# Patient Record
Sex: Male | Born: 1990 | Race: White | Hispanic: No | Marital: Single | Smoking: Current every day smoker
Health system: Southern US, Community
[De-identification: ages and names within clinical notes are randomized; demographics above are authoritative.]

---
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
## 2013-04-07 ENCOUNTER — Encounter (HOSPITAL_COMMUNITY): Payer: Self-pay | Admitting: Emergency Medicine

## 2013-04-07 ENCOUNTER — Emergency Department (HOSPITAL_COMMUNITY)
Admission: EM | Admit: 2013-04-07 | Discharge: 2013-04-07 | Disposition: A | Discharge status: Rehabilitation Facility | Payer: 59 | Attending: Emergency Medicine | Admitting: Emergency Medicine

## 2013-04-07 DIAGNOSIS — F1410 Cocaine abuse, uncomplicated: Secondary | ICD-10-CM | POA: Insufficient documentation

## 2013-04-07 DIAGNOSIS — F32A Depression, unspecified: Secondary | ICD-10-CM

## 2013-04-07 DIAGNOSIS — F329 Major depressive disorder, single episode, unspecified: Secondary | ICD-10-CM | POA: Insufficient documentation

## 2013-04-07 DIAGNOSIS — F3289 Other specified depressive episodes: Secondary | ICD-10-CM | POA: Insufficient documentation

## 2013-04-07 DIAGNOSIS — F411 Generalized anxiety disorder: Secondary | ICD-10-CM | POA: Insufficient documentation

## 2013-04-07 DIAGNOSIS — F1120 Opioid dependence, uncomplicated: Secondary | ICD-10-CM

## 2013-04-07 DIAGNOSIS — F1210 Cannabis abuse, uncomplicated: Secondary | ICD-10-CM | POA: Insufficient documentation

## 2013-04-07 LAB — RAPID URINE DRUG SCREEN, HOSP PERFORMED
AMPHETAMINES: NOT DETECTED
BARBITURATES: NOT DETECTED
BENZODIAZEPINES: NOT DETECTED
COCAINE: POSITIVE — AB
Opiates: NOT DETECTED
TETRAHYDROCANNABINOL: POSITIVE — AB

## 2013-04-07 LAB — CBC
HEMATOCRIT: 42.2 % (ref 39.0–52.0)
Hemoglobin: 14.6 g/dL (ref 13.0–17.0)
MCH: 31.1 pg (ref 26.0–34.0)
MCHC: 34.6 g/dL (ref 30.0–36.0)
MCV: 89.8 fL (ref 78.0–100.0)
Platelets: 311 10*3/uL (ref 150–400)
RBC: 4.7 MIL/uL (ref 4.22–5.81)
RDW: 12.2 % (ref 11.5–15.5)
WBC: 6.3 10*3/uL (ref 4.0–10.5)

## 2013-04-07 LAB — COMPREHENSIVE METABOLIC PANEL
ALBUMIN: 4.4 g/dL (ref 3.5–5.2)
ALK PHOS: 49 U/L (ref 39–117)
ALT: 12 U/L (ref 0–53)
AST: 16 U/L (ref 0–37)
BUN: 10 mg/dL (ref 6–23)
CO2: 26 meq/L (ref 19–32)
Calcium: 9.7 mg/dL (ref 8.4–10.5)
Chloride: 106 mEq/L (ref 96–112)
Creatinine, Ser: 1.09 mg/dL (ref 0.50–1.35)
GFR calc Af Amer: >90 mL/min (ref >90)
Glucose, Bld: 101 mg/dL — ABNORMAL HIGH (ref 70–99)
POTASSIUM: 4.1 meq/L (ref 3.7–5.3)
SODIUM: 145 meq/L (ref 137–147)
Total Bilirubin: 0.6 mg/dL (ref 0.3–1.2)
Total Protein: 7.9 g/dL (ref 6.0–8.3)

## 2013-04-07 LAB — ETHANOL

## 2013-04-07 LAB — SALICYLATE LEVEL

## 2013-04-07 LAB — ACETAMINOPHEN LEVEL

## 2013-04-07 MED ORDER — ONDANSETRON HCL 4 MG PO TABS: 4.0000 mg | ORAL_TABLET | Freq: Three times a day (TID) | ORAL | Status: DC | PRN

## 2013-04-07 MED ORDER — LORAZEPAM 1 MG PO TABS: 1.0000 mg | ORAL_TABLET | Freq: Three times a day (TID) | ORAL | Status: DC | PRN

## 2013-04-07 MED ORDER — NICOTINE 21 MG/24HR TD PT24: 21.0000 mg | MEDICATED_PATCH | Freq: Every day | TRANSDERMAL | Status: DC

## 2013-04-07 MED ORDER — ZOLPIDEM TARTRATE 5 MG PO TABS: 5.0000 mg | ORAL_TABLET | Freq: Every evening | ORAL | Status: DC | PRN

## 2013-04-07 MED ORDER — ALUM & MAG HYDROXIDE-SIMETH 200-200-20 MG/5ML PO SUSP: 30.0000 mL | ORAL | Status: DC | PRN

## 2013-04-07 MED ORDER — IBUPROFEN 400 MG PO TABS: 600.0000 mg | ORAL_TABLET | Freq: Three times a day (TID) | ORAL | Status: DC | PRN

## 2013-04-07 MED ORDER — ACETAMINOPHEN 325 MG PO TABS: 650.0000 mg | ORAL_TABLET | ORAL | Status: DC | PRN

## 2013-04-07 NOTE — ED Notes (Signed)
Per [facility] they are glad to accept pt.

## 2013-04-07 NOTE — ED Notes (Signed)
Referral made to [facility]

## 2013-04-07 NOTE — Discharge Instructions (Signed)
Pt to be discharged by grandfather to treatment facility.

## 2013-04-07 NOTE — ED Provider Notes (Signed)
Medical screening examination/treatment/procedure(s) were performed by non-physician practitioner and as supervising physician I was immediately available for consultation/collaboration.  EKG Interpretation   None        Shunta Mclaurin, MD 04/07/13 2132 

## 2013-04-07 NOTE — ED Notes (Signed)
Meal ordered

## 2013-04-07 NOTE — ED Notes (Signed)
Pt requesting detox from drugs. Last used marijuana today but needs detox from oxycodone and cocaine. Denies any SI or HI.

## 2013-04-07 NOTE — ED Provider Notes (Signed)
CSN: [id number]     Arrival date & time 04/07/13  1342 History   First MD Initiated Contact with Patient 04/07/13 1454     Chief Complaint  Patient presents with  . Medical Clearance   (Consider location/radiation/quality/duration/timing/severity/associated sxs/prior Treatment) HPI  Patient to the ER with requesting detox from cocaine and oxycodone. He has been using Oxycodone for the past year 1-1.5 years. He says that he was clean a few months ago for 14 days and then started using cocaine about a week ago. He reports really liking to snort stuff. He was kicked out of his suboxide clinic for two failed drug tests. His dad committed suicide two months ago (shot himself in the head, long hx of alcohol abuse) and he says he has been really depressed and having trouble coping. He denies SI/HI. He denies ETOH or other drugs. Admits to marijuana use. He denies hallucinations. He denies having any medical concerns at this time or hx of chronic health problems. No medications on a daily basis.  History reviewed. No pertinent past medical history. History reviewed. No pertinent past surgical history. History reviewed. No pertinent family history. History  Substance Use Topics  . Smoking status: Never Smoker   . Smokeless tobacco: Not on file  . Alcohol Use: No    Review of Systems The patient denies anorexia, fever, weight loss,, vision loss, decreased hearing, hoarseness, chest pain, syncope, dyspnea on exertion, peripheral edema, balance deficits, hemoptysis, abdominal pain, melena, hematochezia, severe indigestion/heartburn, hematuria, incontinence, genital sores, muscle weakness, suspicious skin lesions, transient blindness, difficulty walking, depression, unusual weight change, abnormal bleeding, enlarged lymph nodes, angioedema, and breast masses.  Allergies  Review of patient's allergies indicates no known allergies.  Home Medications  No current outpatient prescriptions on file. BP  134/85  Pulse 67  Temp(Src) 98 F (36.7 C) (Oral)  Resp 20  Ht 5\' 11"  (1.803 m)  Wt 157 lb 1.6 oz (71.26 kg)  BMI 21.92 kg/m2  SpO2 100% Physical Exam  Nursing note and vitals reviewed. Constitutional: He appears well-developed and well-nourished. No distress.  HENT:  Head: Normocephalic and atraumatic.  Eyes: Pupils are equal, round, and reactive to light.  Neck: Normal range of motion. Neck supple.  Cardiovascular: Normal rate and regular rhythm.   Pulmonary/Chest: Effort normal.  Abdominal: Soft.  Neurological: He is alert.  Skin: Skin is warm and dry.  Psychiatric: His mood appears anxious. His speech is not rapid and/or pressured. Thought content is not paranoid. He exhibits a depressed mood. He expresses no homicidal and no suicidal ideation.    ED Course  Procedures (including critical care time) Labs Review Labs Reviewed  COMPREHENSIVE METABOLIC PANEL - Abnormal; Notable for the following:    Glucose, Bld 101 (*)    All other components within normal limits  SALICYLATE LEVEL - Abnormal; Notable for the following:    Salicylate Lvl <2.0 (*)    All other components within normal limits  URINE RAPID DRUG SCREEN (HOSP PERFORMED) - Abnormal; Notable for the following:    Cocaine POSITIVE (*)    Tetrahydrocannabinol POSITIVE (*)    All other components within normal limits  ACETAMINOPHEN LEVEL  CBC  ETHANOL   Imaging Review No results found.  EKG Interpretation   None       MDM   1. Depression   2. Opiate addiction    Home med rec completed. Labs reviewed- medically cleared Holding orders placed TTS consult ordered.    Dorthula Matasiffany G Grethel Zenk, PA-C 04/07/13  1524 

## 2013-04-07 NOTE — ED Notes (Addendum)
[facility] with [facility] doing a phone assessment. Pt mother is bringing the copay for pt

## 2018-10-23 ENCOUNTER — Other Ambulatory Visit: Payer: Self-pay

## 2018-10-23 ENCOUNTER — Emergency Department (HOSPITAL_COMMUNITY)
Admission: EM | Admit: 2018-10-23 | Discharge: 2018-10-23 | Disposition: A | Discharge status: Home / Self Care | Payer: Medicaid - Out of State | Attending: Emergency Medicine | Admitting: Emergency Medicine

## 2018-10-23 ENCOUNTER — Encounter (HOSPITAL_COMMUNITY): Payer: Self-pay | Admitting: Emergency Medicine

## 2018-10-23 DIAGNOSIS — T40601A Poisoning by unspecified narcotics, accidental (unintentional), initial encounter: Secondary | ICD-10-CM | POA: Diagnosis not present

## 2018-10-23 DIAGNOSIS — R112 Nausea with vomiting, unspecified: Secondary | ICD-10-CM | POA: Insufficient documentation

## 2018-10-23 DIAGNOSIS — Y905 Blood alcohol level of 100-119 mg/100 ml: Secondary | ICD-10-CM | POA: Insufficient documentation

## 2018-10-23 DIAGNOSIS — R740 Nonspecific elevation of levels of transaminase and lactic acid dehydrogenase [LDH]: Secondary | ICD-10-CM | POA: Insufficient documentation

## 2018-10-23 DIAGNOSIS — F10920 Alcohol use, unspecified with intoxication, uncomplicated: Secondary | ICD-10-CM | POA: Insufficient documentation

## 2018-10-23 DIAGNOSIS — R7401 Elevation of levels of liver transaminase levels: Secondary | ICD-10-CM

## 2018-10-23 LAB — CBC WITH DIFFERENTIAL/PLATELET
Abs Immature Granulocytes: 0.02 10*3/uL (ref 0.00–0.07)
Basophils Absolute: 0 10*3/uL (ref 0.0–0.1)
Basophils Relative: 0 %
Eosinophils Absolute: 0.1 10*3/uL (ref 0.0–0.5)
Eosinophils Relative: 2 %
HCT: 41.4 % (ref 39.0–52.0)
Hemoglobin: 13.6 g/dL (ref 13.0–17.0)
Immature Granulocytes: 0 %
Lymphocytes Relative: 33 %
Lymphs Abs: 2.3 10*3/uL (ref 0.7–4.0)
MCH: 31.1 pg (ref 26.0–34.0)
MCHC: 32.9 g/dL (ref 30.0–36.0)
MCV: 94.7 fL (ref 80.0–100.0)
Monocytes Absolute: 0.4 10*3/uL (ref 0.1–1.0)
Monocytes Relative: 6 %
Neutro Abs: 4.1 10*3/uL (ref 1.7–7.7)
Neutrophils Relative %: 59 %
Platelets: 235 10*3/uL (ref 150–400)
RBC: 4.37 MIL/uL (ref 4.22–5.81)
RDW: 12.3 % (ref 11.5–15.5)
WBC: 6.9 10*3/uL (ref 4.0–10.5)
nRBC: 0 % (ref 0.0–0.2)

## 2018-10-23 LAB — COMPREHENSIVE METABOLIC PANEL
ALT: 174 U/L — ABNORMAL HIGH (ref 0–44)
AST: 98 U/L — ABNORMAL HIGH (ref 15–41)
Albumin: 3.8 g/dL (ref 3.5–5.0)
Alkaline Phosphatase: 41 U/L (ref 38–126)
Anion gap: 10 (ref 5–15)
BUN: 15 mg/dL (ref 6–20)
CO2: 22 mmol/L (ref 22–32)
Calcium: 8.1 mg/dL — ABNORMAL LOW (ref 8.9–10.3)
Chloride: 109 mmol/L (ref 98–111)
Creatinine, Ser: 1.14 mg/dL (ref 0.61–1.24)
GFR calc Af Amer: >60 mL/min (ref >60)
GFR calc non Af Amer: >60 mL/min (ref >60)
Glucose, Bld: 101 mg/dL — ABNORMAL HIGH (ref 70–99)
Potassium: 3.8 mmol/L (ref 3.5–5.1)
Sodium: 141 mmol/L (ref 135–145)
Total Bilirubin: 0.4 mg/dL (ref 0.3–1.2)
Total Protein: 6.6 g/dL (ref 6.5–8.1)

## 2018-10-23 LAB — RAPID URINE DRUG SCREEN, HOSP PERFORMED
Amphetamines: NOT DETECTED
Barbiturates: NOT DETECTED
Benzodiazepines: NOT DETECTED
Cocaine: NOT DETECTED
Opiates: NOT DETECTED
Tetrahydrocannabinol: POSITIVE — AB

## 2018-10-23 LAB — SALICYLATE LEVEL: Salicylate Lvl: <7 mg/dL (ref 2.8–30.0)

## 2018-10-23 LAB — ACETAMINOPHEN LEVEL: Acetaminophen (Tylenol), Serum: <10 ug/mL — ABNORMAL LOW (ref 10–30)

## 2018-10-23 LAB — ETHANOL: Alcohol, Ethyl (B): 108 mg/dL — ABNORMAL HIGH (ref <10)

## 2018-10-23 MED ORDER — SODIUM CHLORIDE 0.9 % IV BOLUS
1000.0000 mL | Freq: Once | INTRAVENOUS | Status: AC
  Administered 2018-10-23: 1000 mL via INTRAVENOUS

## 2018-10-23 MED ORDER — NALOXONE HCL 4 MG/0.1ML NA LIQD: NASAL | 0 refills | Status: AC

## 2018-10-23 NOTE — ED Triage Notes (Signed)
EMS called out because patient snorted heroin, pt was unconscious on arrival.Pt has ETOH also on board, pt vomited while on EMS van. 2 mg Narcan by police nasally. Pt given 4 mg Zofran by ems.

## 2018-10-23 NOTE — ED Provider Notes (Signed)
[facility] EMERGENCY DEPARTMENT Provider Note   CSN: [id number] Arrival date & time: 10/23/18  0009    History   Chief Complaint Chief Complaint  Patient presents with  . Ingestion    HPI Jacob Johns is a 28 y.o. male.   The history is provided by the patient and the EMS personnel.  Ingestion  He was brought in by ambulance after an apparent overdose.  He was found unconscious and given naloxone by police.  He vomited following that and was given ondansetron by EMS.  Patient admits to snorting heroin and drinking alcohol but is extremely evasive about how much either he used.  He denies other drug use.  History reviewed. No pertinent past medical history.  There are no active problems to display for this patient.   History reviewed. No pertinent surgical history.      Home Medications    Prior to Admission medications   Not on File    Family History History reviewed. No pertinent family history.  Social History Social History   Tobacco Use  . Smoking status: Never Smoker  Substance Use Topics  . Alcohol use: No  . Drug use: Yes    Types: Marijuana, Cocaine     Allergies   Patient has no known allergies.   Review of Systems Review of Systems  All other systems reviewed and are negative.    Physical Exam Updated Vital Signs BP 123/72 (BP Location: Left Arm)   Pulse 94   Temp 99.2 F (37.3 C) (Oral)   Ht 6' (1.829 m)   Wt 81.6 kg   SpO2 94%   BMI 24.41 kg/m   Physical Exam Vitals signs and nursing note reviewed.    28 year old male, resting comfortably and in no acute distress. Vital signs are normal. Oxygen saturation is 94%, which is normal. Head is normocephalic and atraumatic. PERRLA, EOMI. Oropharynx is clear. Neck is nontender and supple without adenopathy or JVD. Back is nontender and there is no CVA tenderness. Lungs are clear without rales, wheezes, or rhonchi. Chest is nontender. Heart has regular rate and rhythm  without murmur. Abdomen is soft, flat, nontender without masses or hepatosplenomegaly and peristalsis is normoactive. Extremities have no cyanosis or edema, full range of motion is present. Skin is warm and dry without rash. Neurologic: Mental status is normal, cranial nerves are intact, there are no motor or sensory deficits.  ED Treatments / Results  Labs (all labs ordered are listed, but only abnormal results are displayed) Labs Reviewed  RAPID URINE DRUG SCREEN, HOSP PERFORMED - Abnormal; Notable for the following components:      Result Value   Tetrahydrocannabinol POSITIVE (*)    All other components within normal limits  COMPREHENSIVE METABOLIC PANEL - Abnormal; Notable for the following components:   Glucose, Bld 101 (*)    Calcium 8.1 (*)    AST 98 (*)    ALT 174 (*)    All other components within normal limits  ACETAMINOPHEN LEVEL - Abnormal; Notable for the following components:   Acetaminophen (Tylenol), Serum <10 (*)    All other components within normal limits  ETHANOL - Abnormal; Notable for the following components:   Alcohol, Ethyl (B) 108 (*)    All other components within normal limits  SALICYLATE LEVEL  CBC WITH DIFFERENTIAL/PLATELET    EKG EKG Interpretation  Date/Time:  Saturday October 23 2018 00:16:31 EDT Ventricular Rate:  92 PR Interval:    QRS Duration: 111 QT  Interval:  363 QTC Calculation: 449 R Axis:   84 Text Interpretation:  Sinus rhythm RSR' in V1 or V2, right VCD or RVH No old tracing to compare Confirmed by Delora Fuel ([id number]) on 10/23/2018 12:19:26 AM  Procedures Procedures   Medications Ordered in ED Medications  sodium chloride 0.9 % bolus 1,000 mL (0 mLs Intravenous Stopped 10/23/18 0214)     Initial Impression / Assessment and Plan / ED Course  I have reviewed the triage vital signs and the nursing notes.  Pertinent lab results that were available during my care of the patient were reviewed by me and considered in my medical  decision making (see chart for details).  Probable fentanyl overdose superimposed on alcohol intoxication.  However, since the patient is a very poor historian, will check screening labs and urine drug screen to make sure no other drugs ingested.  He will need to be observed in the ED for 4 hours.  Old records are reviewed, and he has does have an ED visit in 2015 for opiate addiction.  He was observed in the emergency department with no recurrence of respiratory depression.  Labs do show elevated transaminase levels which are slightly worse than previous, probably related to ongoing ethanol abuse.  Ethanol level is only mildly elevated.  Drug screen is positive for tetrahydrocannabinol but not opiates.  However, given his clinical response to naloxone, this is likely a fentanyl overdose.  He is given a naloxone take-home kit.  Final Clinical Impressions(s) / ED Diagnoses   Final diagnoses:  Opiate overdose, accidental or unintentional, initial encounter ([place])  Alcohol intoxication, uncomplicated ([place])  Non-intractable vomiting with nausea, unspecified vomiting type  Elevated transaminase level    ED Discharge Orders         Ordered    naloxone [facility]) nasal spray 4 mg/0.1 mL     10/23/18 4982           Delora Fuel, MD 64/15/83 0430

## 2018-10-23 NOTE — Discharge Instructions (Signed)
DO NOT USE DRUGS!!! 

## 2020-10-18 ENCOUNTER — Ambulatory Visit (HOSPITAL_COMMUNITY)
Admission: EM | Admit: 2020-10-18 | Discharge: 2020-10-19 | Disposition: A | Discharge status: Other Acute Inpatient Hospital | Payer: Medicaid - Out of State | Attending: Psychiatry | Admitting: Psychiatry

## 2020-10-18 ENCOUNTER — Other Ambulatory Visit: Payer: Self-pay

## 2020-10-18 DIAGNOSIS — F1590 Other stimulant use, unspecified, uncomplicated: Secondary | ICD-10-CM | POA: Insufficient documentation

## 2020-10-18 DIAGNOSIS — Z560 Unemployment, unspecified: Secondary | ICD-10-CM | POA: Insufficient documentation

## 2020-10-18 DIAGNOSIS — F1190 Opioid use, unspecified, uncomplicated: Secondary | ICD-10-CM | POA: Insufficient documentation

## 2020-10-18 DIAGNOSIS — F332 Major depressive disorder, recurrent severe without psychotic features: Secondary | ICD-10-CM | POA: Insufficient documentation

## 2020-10-18 DIAGNOSIS — Z20822 Contact with and (suspected) exposure to covid-19: Secondary | ICD-10-CM | POA: Insufficient documentation

## 2020-10-18 DIAGNOSIS — Z79899 Other long term (current) drug therapy: Secondary | ICD-10-CM | POA: Insufficient documentation

## 2020-10-18 DIAGNOSIS — F1910 Other psychoactive substance abuse, uncomplicated: Secondary | ICD-10-CM | POA: Insufficient documentation

## 2020-10-18 DIAGNOSIS — F1290 Cannabis use, unspecified, uncomplicated: Secondary | ICD-10-CM | POA: Insufficient documentation

## 2020-10-18 LAB — POCT URINE DRUG SCREEN - MANUAL ENTRY (I-SCREEN)
POC Amphetamine UR: NOT DETECTED
POC Buprenorphine (BUP): POSITIVE — AB
POC Cocaine UR: POSITIVE — AB
POC Marijuana UR: POSITIVE — AB
POC Methadone UR: NOT DETECTED
POC Methamphetamine UR: NOT DETECTED
POC Morphine: NOT DETECTED
POC Oxazepam (BZO): NOT DETECTED
POC Oxycodone UR: POSITIVE — AB
POC Secobarbital (BAR): POSITIVE — AB

## 2020-10-18 NOTE — ED Provider Notes (Addendum)
Behavioral Health Urgent Care Medical Screening Exam  Patient Name: Jacob Johns MRN: [id number] Date of Evaluation: 10/18/20 Chief Complaint:   Diagnosis:  Final diagnoses:  Polysubstance abuse (HCC)  MDD (major depressive disorder), recurrent severe, without psychosis (HCC)    History of Present illness: Jacob Johns is a 30 y.o. male with psychiatric history of depression, polysubstance abuse, and overdose. Patient presented to [facility] with complaint of worsening depression and requesting substance abuse treatment. Patient admits to daily use of fentanyl, klonopin, and marijuana; he also reports occasional use of meth (2-3 times per month). He reports that he uses approximately 1 gram of fentanyl/day, 2-3 pills of Klonopin/day (he's unsure of the dosage), and 1-3 blunts of marijuana/day.   He reports that he is depressed due to his inability to maintain sobriety. He report he was able to achieve an 18 months period of sobriety while he was incarcerated, he was last sober in March 2020. He endorses depressive symptoms of worthlessness, hopelessness, poor sleep, poor appetite, isolation, and poor concentration. He reports that he is prescibed Zoloft and trazodone but has not been taking these medication due to "I'm scared I'm going to overdose if I mixed my prescription medication with the drugs in my system." He reports that he overdosed on fentanyl twice in July 2022.   He reports that he is motivated to get sober for his new baby that is due on October 25 2020. He states "I don't want to meet by baby as a drug addict." He reports he last attempted substance abuse treatment in [place] but was medically discharged from inpatient psychiatric treatment due to rashes on his legs that required antibiotic treatment.  Patient seen face to face and chart reviewed by this NP. Patient is alert and oriented. He denies all medical complaint. He is noted with abrasions to his legs. He is calm and  cooperative. Patient's thought process is coherent, his speech is clear and coherent. He denies SI/HI/AVH/paranoia and there was no indication that patient was responding to any internal/external stimuli or experiencing delusional thought content during this assessment. He reports he last used Klonopin yesterday 10/17/20, fentanyl 2 days ago, and used marijuana approximately 1 week ago. He says he recently relocated from [place] and currently residing with his sister. He is currently unemployed.    Psychiatric Specialty Exam  Presentation  General Appearance:Appropriate for Environment  Eye Contact:Good  Speech:Clear and Coherent  Speech Volume:Decreased  Handedness:Right   Mood and Affect  Mood:Depressed  Affect:Congruent   Thought Process  Thought Processes:Coherent  Descriptions of Associations:Intact  Orientation:Full (Time, Place and Person)  Thought Content:WDL    Hallucinations:None  Ideas of Reference:None  Suicidal Thoughts:No  Homicidal Thoughts:No   Sensorium  Memory:Immediate Good; Remote Good; Recent Good  Judgment:Fair  Insight:Good   Executive Functions  Concentration:Good  Attention Span:Good  Recall:Good  Fund of Knowledge:Good  Language:Good   Psychomotor Activity  Psychomotor Activity:Normal   Assets  Assets: No data recorded  Sleep  Sleep:Poor  Number of hours: 4   No data recorded  Physical Exam: Physical Exam Constitutional:      General: He is not in acute distress.    Appearance: Normal appearance. He is not ill-appearing, toxic-appearing or diaphoretic.  HENT:     Head: Normocephalic.  Eyes:     General:        Right eye: No discharge.        Left eye: No discharge.  Cardiovascular:     Rate  and Rhythm: Normal rate.  Pulmonary:     Effort: Pulmonary effort is normal. No respiratory distress.  Musculoskeletal:        General: Normal range of motion.     Cervical back: Normal range of motion.   Skin:    Comments: Abrasion to legs  Neurological:     Mental Status: He is alert and oriented to person, place, and time.  Psychiatric:        Attention and Perception: Attention normal. He is attentive. He does not perceive auditory or visual hallucinations.        Mood and Affect: Mood is anxious and depressed.        Speech: Speech normal.        Behavior: Behavior normal. Behavior is cooperative.        Thought Content: Thought content normal. Thought content is not paranoid or delusional. Thought content does not include homicidal or suicidal ideation. Thought content does not include homicidal or suicidal plan.        Cognition and Memory: Cognition normal.   Review of Systems  Constitutional: Negative.   HENT: Negative.    Eyes: Negative.   Respiratory: Negative.    Cardiovascular: Negative.   Gastrointestinal: Negative.   Genitourinary: Negative.   Musculoskeletal: Negative.   Skin:        Abrasion to legs   Neurological: Negative.   Endo/Heme/Allergies: Negative.   Psychiatric/Behavioral:  Positive for depression and substance abuse. Negative for hallucinations, memory loss and suicidal ideas. The patient has insomnia. The patient is not nervous/anxious.   Blood pressure 117/75, pulse 92, temperature 98.5 F (36.9 C), temperature source Oral, resp. rate 18, SpO2 98 %. There is no height or weight on file to calculate BMI.  Musculoskeletal: Strength & Muscle Tone: within normal limits Gait & Station: normal Patient leans: Right   [facility] MSE Discharge Disposition for Follow up and Recommendations: Based on my evaluation I certify that psychiatric inpatient services furnished can reasonably be expected to improve the patient's condition which I recommend transfer to an appropriate accepting facility.    Maricela Bo, NP 10/18/2020, 11:18 PM

## 2020-10-18 NOTE — Progress Notes (Signed)
   10/18/20 2157  [facility] Triage Screening (Walk-ins at [facility] only)  How Did You Hear About [place]? Self  What Is the Reason for Your Visit/Call Today? Jacob Johns is a 30 year old male presenting voluntarily to [facility] due SI with plan to overdose on drugs. Patient reported diagnosis of schizophrenia. Patient reported onset of SI for 1 month. Patient reported stressors/triggers include loosing job 2 months ago, worsening depression and drug usage. Patient reported expecting birth of daughter 10/25/20, "I don't want her to meet me, I look in the mirror and hate everything I see". Patient reported using fentanyl daily, pain pills daily, methamphetamines 5-6x monthly and marijuana whenever there is money left over. Patient reported auditory hallucinations and having full conversations with voices. Patient denied command voices. Patient was unable to contract for safety. Patient was cooperative during assessment.  How Long Has This Been Causing You Problems? 1 wk - 1 month  Have You Recently Had Any Thoughts About Hurting Yourself? Yes  How long ago did you have thoughts about hurting yourself? today  Are You Planning to Commit Suicide/Harm Yourself At This time? Yes  Have you Recently Had Thoughts About Hurting Someone [name]? No  Are You Planning To Harm Someone At This Time? No  Are you currently experiencing any auditory, visual or other hallucinations? No  Have You Used Any Alcohol or Drugs in the Past 24 Hours? Yes  How long ago did you use Drugs or Alcohol? today  What Did You Use and How Much? fentanyl and pain pills  Do you have any current medical co-morbidities that require immediate attention? No  Clinician description of patient physical appearance/behavior: normal  What Do You Feel Would Help You the Most Today? Treatment for Depression or other mood problem;Alcohol or Drug Use Treatment  If access to [facility] Urgent [facility] was not available, would you have sought care in the Emergency Department? Yes   Determination of Need Emergent (2 hours)  Options For Referral Chemical Dependency Intensive Outpatient Therapy (CDIOP)

## 2020-10-19 ENCOUNTER — Inpatient Hospital Stay (HOSPITAL_COMMUNITY)
Admission: AD | Admit: 2020-10-19 | Discharge: 2020-10-23 | DRG: 897 | Disposition: A | Discharge status: Home / Self Care | Payer: Medicaid - Out of State | Attending: Emergency Medicine | Admitting: Emergency Medicine

## 2020-10-19 ENCOUNTER — Encounter (HOSPITAL_COMMUNITY): Payer: Self-pay | Admitting: Urology

## 2020-10-19 DIAGNOSIS — Y905 Blood alcohol level of 100-119 mg/100 ml: Secondary | ICD-10-CM | POA: Diagnosis present

## 2020-10-19 DIAGNOSIS — F1994 Other psychoactive substance use, unspecified with psychoactive substance-induced mood disorder: Secondary | ICD-10-CM | POA: Diagnosis not present

## 2020-10-19 DIAGNOSIS — F1124 Opioid dependence with opioid-induced mood disorder: Secondary | ICD-10-CM | POA: Diagnosis present

## 2020-10-19 DIAGNOSIS — R45851 Suicidal ideations: Secondary | ICD-10-CM | POA: Diagnosis present

## 2020-10-19 DIAGNOSIS — F1020 Alcohol dependence, uncomplicated: Secondary | ICD-10-CM | POA: Diagnosis present

## 2020-10-19 DIAGNOSIS — Z20822 Contact with and (suspected) exposure to covid-19: Secondary | ICD-10-CM | POA: Diagnosis present

## 2020-10-19 DIAGNOSIS — F17210 Nicotine dependence, cigarettes, uncomplicated: Secondary | ICD-10-CM | POA: Diagnosis present

## 2020-10-19 DIAGNOSIS — F419 Anxiety disorder, unspecified: Secondary | ICD-10-CM | POA: Diagnosis present

## 2020-10-19 DIAGNOSIS — F1120 Opioid dependence, uncomplicated: Secondary | ICD-10-CM | POA: Diagnosis present

## 2020-10-19 DIAGNOSIS — F332 Major depressive disorder, recurrent severe without psychotic features: Secondary | ICD-10-CM | POA: Insufficient documentation

## 2020-10-19 DIAGNOSIS — G4700 Insomnia, unspecified: Secondary | ICD-10-CM | POA: Diagnosis present

## 2020-10-19 DIAGNOSIS — F1920 Other psychoactive substance dependence, uncomplicated: Secondary | ICD-10-CM | POA: Diagnosis present

## 2020-10-19 LAB — CBC WITH DIFFERENTIAL/PLATELET
Abs Immature Granulocytes: 0.04 10*3/uL (ref 0.00–0.07)
Basophils Absolute: 0 10*3/uL (ref 0.0–0.1)
Basophils Relative: 0 %
Eosinophils Absolute: 0.1 10*3/uL (ref 0.0–0.5)
Eosinophils Relative: 1 %
HCT: 39.4 % (ref 39.0–52.0)
Hemoglobin: 12.7 g/dL — ABNORMAL LOW (ref 13.0–17.0)
Immature Granulocytes: 0 %
Lymphocytes Relative: 23 %
Lymphs Abs: 2.5 10*3/uL (ref 0.7–4.0)
MCH: 30.2 pg (ref 26.0–34.0)
MCHC: 32.2 g/dL (ref 30.0–36.0)
MCV: 93.6 fL (ref 80.0–100.0)
Monocytes Absolute: 0.6 10*3/uL (ref 0.1–1.0)
Monocytes Relative: 6 %
Neutro Abs: 7.8 10*3/uL — ABNORMAL HIGH (ref 1.7–7.7)
Neutrophils Relative %: 70 %
Platelets: 402 10*3/uL — ABNORMAL HIGH (ref 150–400)
RBC: 4.21 MIL/uL — ABNORMAL LOW (ref 4.22–5.81)
RDW: 15.9 % — ABNORMAL HIGH (ref 11.5–15.5)
WBC: 11.1 10*3/uL — ABNORMAL HIGH (ref 4.0–10.5)
nRBC: 0 % (ref 0.0–0.2)

## 2020-10-19 LAB — HEMOGLOBIN A1C
Hgb A1c MFr Bld: 5.7 % — ABNORMAL HIGH (ref 4.8–5.6)
Mean Plasma Glucose: 116.89 mg/dL

## 2020-10-19 LAB — COMPREHENSIVE METABOLIC PANEL
ALT: 173 U/L — ABNORMAL HIGH (ref 0–44)
AST: 62 U/L — ABNORMAL HIGH (ref 15–41)
Albumin: 3.6 g/dL (ref 3.5–5.0)
Alkaline Phosphatase: 120 U/L (ref 38–126)
Anion gap: 8 (ref 5–15)
BUN: 16 mg/dL (ref 6–20)
CO2: 30 mmol/L (ref 22–32)
Calcium: 9.5 mg/dL (ref 8.9–10.3)
Chloride: 102 mmol/L (ref 98–111)
Creatinine, Ser: 0.93 mg/dL (ref 0.61–1.24)
GFR, Estimated: >60 mL/min (ref >60)
Glucose, Bld: 84 mg/dL (ref 70–99)
Potassium: 4.3 mmol/L (ref 3.5–5.1)
Sodium: 140 mmol/L (ref 135–145)
Total Bilirubin: 0.4 mg/dL (ref 0.3–1.2)
Total Protein: 7.7 g/dL (ref 6.5–8.1)

## 2020-10-19 LAB — RESP PANEL BY RT-PCR (FLU A&B, COVID) ARPGX2
Influenza A by PCR: NEGATIVE
Influenza B by PCR: NEGATIVE
SARS Coronavirus 2 by RT PCR: NEGATIVE

## 2020-10-19 LAB — LIPID PANEL
Cholesterol: 66 mg/dL (ref 0–200)
HDL: 26 mg/dL — ABNORMAL LOW (ref >40)
LDL Cholesterol: 30 mg/dL (ref 0–99)
Total CHOL/HDL Ratio: 2.5 RATIO
Triglycerides: 49 mg/dL (ref <150)
VLDL: 10 mg/dL (ref 0–40)

## 2020-10-19 LAB — T4, FREE: Free T4: 0.94 ng/dL (ref 0.61–1.12)

## 2020-10-19 LAB — TSH: TSH: 5.733 u[IU]/mL — ABNORMAL HIGH (ref 0.350–4.500)

## 2020-10-19 MED ORDER — CARBAMAZEPINE 100 MG PO CHEW
100.0000 mg | CHEWABLE_TABLET | Freq: Three times a day (TID) | ORAL | Status: DC
  Administered 2020-10-19 – 2020-10-22 (×9): 100 mg via ORAL
  Filled 2020-10-19 (×16): qty 1

## 2020-10-19 MED ORDER — ENSURE ENLIVE PO LIQD
237.0000 mL | Freq: Two times a day (BID) | ORAL | Status: DC
  Administered 2020-10-19 – 2020-10-22 (×8): 237 mL via ORAL
  Filled 2020-10-19 (×15): qty 237

## 2020-10-19 MED ORDER — HYDROXYZINE HCL 25 MG PO TABS: 25.0000 mg | ORAL_TABLET | Freq: Four times a day (QID) | ORAL | Status: DC | PRN

## 2020-10-19 MED ORDER — LORAZEPAM 1 MG PO TABS
1.0000 mg | ORAL_TABLET | Freq: Three times a day (TID) | ORAL | Status: AC
  Administered 2020-10-20 (×3): 1 mg via ORAL
  Filled 2020-10-19 (×3): qty 1

## 2020-10-19 MED ORDER — LORAZEPAM 1 MG PO TABS
1.0000 mg | ORAL_TABLET | Freq: Every day | ORAL | Status: AC
  Administered 2020-10-22: 1 mg via ORAL
  Filled 2020-10-19: qty 1

## 2020-10-19 MED ORDER — LORAZEPAM 1 MG PO TABS: 1.0000 mg | ORAL_TABLET | Freq: Once | ORAL | Status: DC

## 2020-10-19 MED ORDER — MAGNESIUM HYDROXIDE 400 MG/5ML PO SUSP: 30.0000 mL | Freq: Every day | ORAL | Status: DC | PRN

## 2020-10-19 MED ORDER — DICYCLOMINE HCL 20 MG PO TABS: 20.0000 mg | ORAL_TABLET | Freq: Four times a day (QID) | ORAL | Status: DC | PRN

## 2020-10-19 MED ORDER — LOPERAMIDE HCL 2 MG PO CAPS: 2.0000 mg | ORAL_CAPSULE | ORAL | Status: DC | PRN

## 2020-10-19 MED ORDER — ONDANSETRON 4 MG PO TBDP
4.0000 mg | ORAL_TABLET | Freq: Four times a day (QID) | ORAL | Status: DC | PRN
  Administered 2020-10-19 – 2020-10-20 (×3): 4 mg via ORAL
  Filled 2020-10-19 (×3): qty 1

## 2020-10-19 MED ORDER — LORAZEPAM 1 MG PO TABS
1.0000 mg | ORAL_TABLET | Freq: Four times a day (QID) | ORAL | Status: AC
  Administered 2020-10-19 – 2020-10-20 (×4): 1 mg via ORAL
  Filled 2020-10-19 (×4): qty 1

## 2020-10-19 MED ORDER — LORAZEPAM 1 MG PO TABS
1.0000 mg | ORAL_TABLET | Freq: Two times a day (BID) | ORAL | Status: AC
  Administered 2020-10-21 (×2): 1 mg via ORAL
  Filled 2020-10-19 (×2): qty 1

## 2020-10-19 MED ORDER — HYDROXYZINE HCL 25 MG PO TABS
25.0000 mg | ORAL_TABLET | Freq: Four times a day (QID) | ORAL | Status: DC | PRN
  Administered 2020-10-19 – 2020-10-21 (×4): 25 mg via ORAL
  Filled 2020-10-19 (×3): qty 1

## 2020-10-19 MED ORDER — LORAZEPAM 1 MG PO TABS
1.0000 mg | ORAL_TABLET | Freq: Once | ORAL | Status: AC
  Administered 2020-10-19: 1 mg via ORAL
  Filled 2020-10-19: qty 1

## 2020-10-19 MED ORDER — METHOCARBAMOL 500 MG PO TABS
500.0000 mg | ORAL_TABLET | Freq: Three times a day (TID) | ORAL | Status: DC | PRN
  Administered 2020-10-20 – 2020-10-21 (×4): 500 mg via ORAL
  Filled 2020-10-19 (×4): qty 1

## 2020-10-19 MED ORDER — TRAZODONE HCL 50 MG PO TABS
50.0000 mg | ORAL_TABLET | Freq: Every evening | ORAL | Status: DC | PRN
  Administered 2020-10-20 – 2020-10-21 (×3): 50 mg via ORAL
  Filled 2020-10-19 (×3): qty 1

## 2020-10-19 MED ORDER — ONDANSETRON 4 MG PO TBDP: 4.0000 mg | ORAL_TABLET | Freq: Four times a day (QID) | ORAL | Status: DC | PRN

## 2020-10-19 MED ORDER — LEVETIRACETAM 250 MG PO TABS
250.0000 mg | ORAL_TABLET | Freq: Two times a day (BID) | ORAL | Status: DC
  Administered 2020-10-19: 250 mg via ORAL
  Filled 2020-10-19 (×5): qty 1

## 2020-10-19 MED ORDER — NICOTINE 14 MG/24HR TD PT24
14.0000 mg | MEDICATED_PATCH | Freq: Every day | TRANSDERMAL | Status: DC
  Administered 2020-10-19 – 2020-10-23 (×5): 14 mg via TRANSDERMAL
  Filled 2020-10-19 (×9): qty 1

## 2020-10-19 MED ORDER — ACETAMINOPHEN 325 MG PO TABS
650.0000 mg | ORAL_TABLET | Freq: Four times a day (QID) | ORAL | Status: DC | PRN
  Administered 2020-10-19 – 2020-10-22 (×5): 650 mg via ORAL
  Filled 2020-10-19 (×6): qty 2

## 2020-10-19 MED ORDER — ALUM & MAG HYDROXIDE-SIMETH 200-200-20 MG/5ML PO SUSP: 30.0000 mL | ORAL | Status: DC | PRN

## 2020-10-19 MED ORDER — NAPROXEN 500 MG PO TABS
500.0000 mg | ORAL_TABLET | Freq: Two times a day (BID) | ORAL | Status: DC | PRN
  Administered 2020-10-20 – 2020-10-22 (×4): 500 mg via ORAL
  Filled 2020-10-19 (×5): qty 1

## 2020-10-19 MED ORDER — LORAZEPAM 1 MG PO TABS: 1.0000 mg | ORAL_TABLET | Freq: Four times a day (QID) | ORAL | Status: DC | PRN

## 2020-10-19 MED ORDER — THIAMINE HCL 100 MG PO TABS
100.0000 mg | ORAL_TABLET | Freq: Every day | ORAL | Status: DC
  Administered 2020-10-20 – 2020-10-23 (×4): 100 mg via ORAL
  Filled 2020-10-19 (×7): qty 1

## 2020-10-19 MED ORDER — ADULT MULTIVITAMIN W/MINERALS CH
1.0000 | ORAL_TABLET | Freq: Every day | ORAL | Status: DC
  Administered 2020-10-19 – 2020-10-23 (×5): 1 via ORAL
  Filled 2020-10-19 (×9): qty 1
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
## 2020-10-19 NOTE — Progress Notes (Signed)
Pt has been accepted to:402-1 to the service of MD Mason Jim - pending negative COVID result.  Please send voluntary form with patient.

## 2020-10-19 NOTE — Progress Notes (Signed)
NUTRITION ASSESSMENT  Pt identified as at risk on the Malnutrition Screen Tool  INTERVENTION: 1. Supplements: Ensure Plus PO BID, each provides 350 kcals and 13g protein  NUTRITION DIAGNOSIS: Unintentional weight loss related to sub-optimal intake as evidenced by pt report.   Goal: Pt to meet >/= 90% of their estimated nutrition needs.  Monitor:  PO intake  Assessment:  Pt admitted for depression and polysubstance abuse (fentanyl, klonopin, THC, meth, EtOH).  Per weight records, pt has lost 16 lbs since August 2020 (2 years), insignificant for time frame. Ensure supplements have been ordered.   Height: Ht Readings from Last 1 Encounters:  10/19/20 5\' 11"  (1.803 m)    Weight: Wt Readings from Last 1 Encounters:  10/19/20 74.4 kg    Weight Hx: Wt Readings from Last 10 Encounters:  10/19/20 74.4 kg  10/23/18 81.6 kg  04/07/13 71.3 kg    BMI:  Body mass index is 22.87 kg/m. Pt meets criteria for normal based on current BMI.  Estimated Nutritional Needs: Kcal: 25-30 kcal/kg Protein: > 1 gram protein/kg Fluid: 1 ml/kcal  Diet Order:  Diet Order             Diet regular Room service appropriate? Yes; Fluid consistency: Thin  Diet effective now                  Pt is also offered choice of unit snacks mid-morning and mid-afternoon.  Pt is eating as desired.   Lab results and medications reviewed.   06/05/13, MS, RD, LDN Inpatient Clinical Dietitian Contact information available via Amion

## 2020-10-19 NOTE — Progress Notes (Signed)
Recreation Therapy Notes  Date: 8.19.22 Time: 0930 Location: 300 Hall Dayroom  Group Topic: Stress Management   Goal Area(s) Addresses:  Patient will actively participate in stress management techniques presented during session.  Patient will successfully identify benefit of practicing stress management post d/c.   Intervention: Relaxation exercise with ambient sound and script   Activity: Guided Imagery. LRT provided education, instruction, and demonstration on practice of visualization via guided imagery. Patient was asked to participate in the technique introduced during session. Patients were given suggestions of ways to access scripts post d/c and encouraged to explore Youtube and other apps available on smartphones, tablets, and computers.  Education:  Stress Management, Discharge Planning.   Education Outcome: Acknowledges education  Clinical Observations/Feedback: Patient did not attend group session.     Jacob Johns, LRT/CTRS         Mikella Linsley A 10/19/2020 12:34 PM 

## 2020-10-19 NOTE — Progress Notes (Signed)
Met with patient upon his arrival to [facility] from [facility] to discuss his substance use patterns.  Patient endorses using 2-3 Klonopin tablets daily (patient unsure of Klonopin dosage) and reports that his last Klonopin/benzodiazepine usage was 2-3 Klonopin tablets on 10/17/2020.  Patient also endorses using 1 g of fentanyl daily and he reports that his last Fentanyl usage was 2 days ago.  Patient does report that he used an unknown amount of Dilaudid around 6:00 PM on 10/18/2020 prior to going to [facility] in order to prolong the development his future withdrawal symptoms.  Patient endorses smoking 1-3 blunts of marijuana daily and reports that his last marijuana use was 1 week ago.  Patient also endorses using meth about 2-3 times per month (patient does not provide details about when he last used meth).  Patient endorses drinking about 2-3 alcoholic beverages daily.  Patient denies history of seizures.  He does endorse history of substance related withdrawal symptoms, and he states that he believes that his withdrawal symptoms are secondary to withdrawal from benzodiazepines/Klonopin and opioids, rather than from alcohol.  Patient endorses history of the following withdrawal symptoms from benzodiazepines and opioids: Body aches/myalgias, chills, tremor, diaphoresis, nausea, vomiting, abdominal pain, diarrhea, and decreased appetite.  Patient states that he is currently experiencing body aches and myalgias and he expresses that he believes he may begin to experience the additional above withdrawal symptoms fairly soon.  Patient denies allergies to any medications.  Patient not taking any home medications at this time.  Based on patient's reported daily benzodiazepine and opioid usage as well as reported withdrawal symptoms, believe that it is necessary for patient to receive adequate benzodiazepine taper as well as adequate additional medication symptom coverage in effort to prevent severe benzodiazepine and opioid related  withdrawal symptoms or seizures.  In order to assist with future development of patient's benzodiazepine/opioid related withdrawal symptoms, as well as coverage for development of any physical symptoms related to potential alcohol withdrawal, will place orders for CIWA and COWS protocol, as well as orders for the following medications:   -One time loading dose now (given at 0504) of Ativan 1 mg PO for benzodiazepine withdrawal prior to initiating Ativan benzodiazepine withdrawal taper below   -Ativan 1 mg Taper for Benzodiazepine withdrawal:   -Ativan 1 mg PO 4 times daily, starting on 10/19/20 at 0800 for 4 doses    -Ativan 1 mg PO 3 times daily, starting on 10/20/20 at 0800 for 3 doses    -Ativan 1 mg PO 2 times daily, starting on 10/21/20 at 0800 for 2 doses   -Ativan 1 mg PO, daily, starting on 10/22/20 at 0800 for 1 dose   -Vistaril 25 mg PO every 6 hours PRN for anxiety/agitation or CIWA </= 10   -Bentyl 20 mg PO every 6 hours PRN for spasms, abdominal cramping   -Imodium capsule 2-4 mg PO PRN for diarrhea or loose stools   -Robaxin 500 mg PO every 8 hours PRN for muscle spasms   -Multivitamin with minerals 1 tablet PO daily for nutritional supplementation   -Naproxen 500 mg PO 2 times daily PRN for aching, pain, or discomfort  -Zofran ODT 4 mg PO every 6 hours PRN for nausea/vomiting   -Thiamine tablet 100 mg PO daily for nutritional supplementation   Patient agrees to the above withdrawal protocols. Patient educated on side effect profiles of medications.   Trazodone 50 mg p.o. at bedtime as needed ordered for sleep as well.    Patient also endorses  smoking about 1 pack to 1.5 packs/day of cigarettes daily.  Order placed for nicotine patch 14 mg p.o. daily x24 hours for nicotine cravings/tobacco cessation.  Patient to notify nursing staff if symptoms worsen or if any additional symptoms or issues arise.
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
## 2020-10-19 NOTE — Progress Notes (Signed)
   10/19/20 1147  Vital Signs  Temp 98.5 F (36.9 C)  Temp Source Oral  Pulse Rate 85  BP 116/73  BP Location Left Arm  BP Method Automatic  Patient Position (if appropriate) Sitting  CIWA-Ar  Nausea and Vomiting 1  Tactile Disturbances 1  Tremor 2  Auditory Disturbances 0  Paroxysmal Sweats 2  Visual Disturbances 0  Anxiety 4  Headache, Fullness in Head 0  Agitation 0  Orientation and Clouding of Sensorium 0  CIWA-Ar Total 10   D: Patient denies SI/HI/AVH. Patient rated anxiety 4/10 and denied depression. Pt. Complained of sweaty, shakiness, tremors fatigue, nausea, and achy joints. Pt. Isolated in his room, but did get up and ask for snacks. Pt. Was pleasant and cooperative. Pt. Signed an HIV consent.  A:  Patient took scheduled medicine. Pt was given Zofran for nausea.  Support and encouragement provided Routine safety checks conducted every 15 minutes. Patient  Informed to notify staff with any concerns.   R: Safety maintained.

## 2020-10-19 NOTE — ED Notes (Signed)
REPORT GIVEN TO RUDY RN@[facility]  ADULT UNIT

## 2020-10-19 NOTE — Tx Team (Signed)
Interdisciplinary Treatment and Diagnostic Plan Update  10/19/2020 Time of Session: 11:30a GOR VESTAL MRN: [id number]  Principal Diagnosis: Substance induced mood disorder ([place])  Secondary Diagnoses: Principal Problem:   Substance induced mood disorder ([place]) Active Problems:   Polysubstance (including opioids) dependence, daily use ([place])   Current Medications:  Current Facility-Administered Medications  Medication Dose Route Frequency Provider Last Rate Last Admin   acetaminophen (TYLENOL) tablet 650 mg  650 mg Oral Q6H PRN Prescilla Sours, PA-C   650 mg at 10/19/20 0936   alum & mag hydroxide-simeth (MAALOX/MYLANTA) 200-200-20 MG/5ML suspension 30 mL  30 mL Oral Q4H PRN Margorie John W, PA-C       carbamazepine (TEGRETOL) chewable tablet 100 mg  100 mg Oral Q8H Arthor Captain, MD   100 mg at 10/19/20 1315   dicyclomine (BENTYL) tablet 20 mg  20 mg Oral Q6H PRN Margorie John W, PA-C       feeding supplement (ENSURE ENLIVE / ENSURE PLUS) liquid 237 mL  237 mL Oral BID BM Nelda Marseille, Amy E, MD   237 mL at 10/19/20 0944   hydrOXYzine (ATARAX/VISTARIL) tablet 25 mg  25 mg Oral Q6H PRN Prescilla Sours, PA-C   25 mg at 10/19/20 5361   loperamide (IMODIUM) capsule 2-4 mg  2-4 mg Oral PRN Margorie John W, PA-C       LORazepam (ATIVAN) tablet 1 mg  1 mg Oral QID Margorie John W, PA-C   1 mg at 10/19/20 1142   Followed by   Derrill Memo ON 10/20/2020] LORazepam (ATIVAN) tablet 1 mg  1 mg Oral TID Prescilla Sours, PA-C       Followed by   Derrill Memo ON 10/21/2020] LORazepam (ATIVAN) tablet 1 mg  1 mg Oral BID Margorie John W, PA-C       Followed by   Derrill Memo ON 10/22/2020] LORazepam (ATIVAN) tablet 1 mg  1 mg Oral Daily Lovena Le, Cody W, PA-C       magnesium hydroxide (MILK OF MAGNESIA) suspension 30 mL  30 mL Oral Daily PRN Lovena Le, Cody W, PA-C       methocarbamol (ROBAXIN) tablet 500 mg  500 mg Oral Q8H PRN Margorie John W, PA-C       multivitamin with minerals tablet 1 tablet  1 tablet Oral Daily Margorie John W,  PA-C   1 tablet at 10/19/20 0936   naproxen (NAPROSYN) tablet 500 mg  500 mg Oral BID PRN Margorie John W, PA-C       nicotine (NICODERM CQ - dosed in mg/24 hours) patch 14 mg  14 mg Transdermal Daily Margorie John W, PA-C   14 mg at 10/19/20 0936   ondansetron (ZOFRAN-ODT) disintegrating tablet 4 mg  4 mg Oral Q6H PRN Prescilla Sours, PA-C   4 mg at 10/19/20 1314   [START ON 10/20/2020] thiamine tablet 100 mg  100 mg Oral Daily Margorie John W, PA-C       traZODone (DESYREL) tablet 50 mg  50 mg Oral QHS PRN Prescilla Sours, PA-C       PTA Medications: Medications Prior to Admission  Medication Sig Dispense Refill Last Dose   naloxone (NARCAN) nasal spray 4 mg/0.1 mL In case of an overdose, squirt into your nostril (Patient not taking: Reported on 10/19/2020) 1 each 0 Not Taking    Patient Stressors: Marital or family conflict Substance abuse  Patient Strengths: Ability for insight Motivation for treatment/growth Supportive family/friends  Treatment Modalities: Medication Management, Group therapy, Case  management,  1 to 1 session with clinician, Psychoeducation, Recreational therapy.   Physician Treatment Plan for Primary Diagnosis: Substance induced mood disorder ([place]) Long Term Goal(s):     Short Term Goals:    Medication Management: Evaluate patient's response, side effects, and tolerance of medication regimen.  Therapeutic Interventions: 1 to 1 sessions, Unit Group sessions and Medication administration.  Evaluation of Outcomes: Not Met  Physician Treatment Plan for Secondary Diagnosis: Principal Problem:   Substance induced mood disorder (H[facility]) Active Problems:   Polysubstance (including opioids) dependence, daily use ([place])  Long Term Goal(s):     Short Term Goals:       Medication Management: Evaluate patient's response, side effects, and tolerance of medication regimen.  Therapeutic Interventions: 1 to 1 sessions, Unit Group sessions and Medication  administration.  Evaluation of Outcomes: Not Met   RN Treatment Plan for Primary Diagnosis: Substance induced mood disorder ([place]) Long Term Goal(s): Knowledge of disease and therapeutic regimen to maintain health will improve  Short Term Goals: Ability to demonstrate self-control, Ability to participate in decision making will improve, and Ability to identify and develop effective coping behaviors will improve  Medication Management: RN will administer medications as ordered by provider, will assess and evaluate patient's response and provide education to patient for prescribed medication. RN will report any adverse and/or side effects to prescribing provider.  Therapeutic Interventions: 1 on 1 counseling sessions, Psychoeducation, Medication administration, Evaluate responses to treatment, Monitor vital signs and CBGs as ordered, Perform/monitor CIWA, COWS, AIMS and Fall Risk screenings as ordered, Perform wound care treatments as ordered.  Evaluation of Outcomes: Not Met   LCSW Treatment Plan for Primary Diagnosis: Substance induced mood disorder ([place]) Long Term Goal(s): Safe transition to appropriate next level of care at discharge, Engage patient in therapeutic group addressing interpersonal concerns.  Short Term Goals: Engage patient in aftercare planning with referrals and resources, Increase social support, and Increase ability to appropriately verbalize feelings  Therapeutic Interventions: Assess for all discharge needs, 1 to 1 time with Social worker, Explore available resources and support systems, Assess for adequacy in community support network, Educate family and significant other(s) on suicide prevention, Complete Psychosocial Assessment, Interpersonal group therapy.  Evaluation of Outcomes: Not Met   Progress in Treatment: Attending groups: No. Participating in groups: No. Taking medication as prescribed: Yes. Toleration medication: Yes. Family/Significant other contact  made: Yes, individual(s) contacted:  sister Patient understands diagnosis: Yes. Discussing patient identified problems/goals with staff: Yes. Medical problems stabilized or resolved: Yes. Denies suicidal/homicidal ideation: Yes. Issues/concerns per patient self-inventory: No. Other: None  New problem(s) identified: No, Describe:  None  New Short Term/Long Term Goal(s):medication stabilization, elimination of SI thoughts, development of comprehensive mental wellness plan.   Patient Goals:  "stay positive"  Discharge Plan or Barriers: Patient recently admitted. CSW will continue to follow and assess for appropriate referrals and possible discharge planning.   Reason for Continuation of Hospitalization: Medication stabilization Suicidal ideation Withdrawal symptoms  Estimated Length of Stay: 3-5 days  Attendees: Patient: Jacob Johns 10/19/2020 2:13 PM  Physician: Dr. Kai Levins 10/19/2020 2:13 PM  Nursing:  10/19/2020 2:13 PM  RN Care Manager: 10/19/2020 2:13 PM  Social Worker: Toney Reil, [place] 10/19/2020 2:13 PM  Recreational Therapist:  10/19/2020 2:13 PM  Other:  10/19/2020 2:13 PM  Other:  10/19/2020 2:13 PM  Other: 10/19/2020 2:13 PM    Scribe for Treatment Team: Mliss Fritz, [place] 10/19/2020 2:13 PM

## 2020-10-19 NOTE — Progress Notes (Addendum)
   10/19/20 1147  Vital Signs  Temp 98.5 F (36.9 C)  Temp Source Oral  Pulse Rate 85  BP 116/73  BP Location Left Arm  BP Method Automatic  Patient Position (if appropriate) Sitting  CIWA-Ar  Nausea and Vomiting 1  Tactile Disturbances 1  Tremor 2  Auditory Disturbances 0  Paroxysmal Sweats 2  Visual Disturbances 0  Anxiety 4  Headache, Fullness in Head 0  Agitation 0  Orientation and Clouding of Sensorium 0  CIWA-Ar Total 10   D: Patient denies SI/HI/AVH. Patient complained of all over body ache and a tooth ache Pt. Complained of anxiety 4/10 and denied depression. Pt. Complained of some nausea. Pt. Reported that he tried to go to group, but didn't feel well and had to leave. Ciwa =10. Pt. Moved to room closer to the nurses station and in a medical bed per DR. Pt. Isolates in room.  A:  Patient took scheduled medicine. Pt  reported that 650 mg of Tylenol relieved some of his tooth pain and 25mg  helped relieve some of his anxiety. Support and encouragement provided Routine safety checks conducted every 15 minutes. Patient  Informed to notify staff with any concerns.   R:  Safety maintained.

## 2020-10-19 NOTE — Progress Notes (Signed)
Admission Note:  Patient is 30 year old male who presents voluntarily to [facility] from [facility] for substance abuse and increased depression. Patient reports he has recently dealt with repeated overdoses and increased depression. Patient reports he is from [place] and is expecting a daughter soon. Patient reports he is depressed and disappointed in himself because he is unable to stop using drugs. Patient currently denies SI/HI at this time. Patient also denies AH/VH. Patient contracts for safety and is safe on the unit at this time.

## 2020-10-19 NOTE — BHH Suicide Risk Assessment (Signed)
[facility] Admission Suicide Risk Assessment   Nursing information obtained from:  Patient Demographic factors:  Male, Caucasian, Low socioeconomic status Current Mental Status:  NA Loss Factors:  Loss of significant relationship, Decrease in vocational status Historical Factors:  Impulsivity Risk Reduction Factors:  Positive social support  Total Time spent with patient:  50 minutes Principal Problem: Substance induced mood disorder ([place]) Diagnosis:  Principal Problem:   Substance induced mood disorder (HCC) Active Problems:   Polysubstance (including opioids) dependence, daily use ([place])  Subjective Data: Medical record reviewed.  Patient's case discussed in detail with staff and members of the treatment team.  I met with and evaluated the patient on the unit today.  Jacob Johns is a 30 year old male with a history of depression, substance use disorder (opiates, benzodiazepines, methamphetamine, marijuana, alcohol including IVDU), hep C positive and history of childhood trauma who presented voluntarily on 10/18/2020 to [facility] reporting worsening depression, increased drug use and suicidal ideation with a plan to overdose on drugs.  At [facility] patient reported a prior diagnosis of schizophrenia and endorsed symptoms of auditory hallucinations.  Patient reported that his symptoms had worsened since losing his job 2 months ago.  Notes from [facility] also document that patient stated he was prescribed trazodone and Zoloft but was not taking them because he did not like mixing them with his street drug use.  He was admitted to [facility] for crisis stabilization and treatment.  During evaluation with me this morning, patient appears tired and poorly engaged in our conversation.  He states that he came to the hospital because he was tired of using drugs and wants to stop using.  He endorsed daily use of fentanyl intravenously for the past 4 months and estimates use of 1 g/day.  Patient reports 2 accidental overdoses on  fentanyl during the past month.  He endorsed daily use of Klonopin (which he purchases off the street) for the past month and estimates that he takes 2 to 3 pills/day of unknown strength.  He consumes alcohol 2-3 times per week up to 5 shots of alcohol per day.  Patient also reports use of methamphetamine by smoking 2-3 times per months.  He denies cocaine use.  He denies barbiturate use.  Patient denies any history of DTs, complicated withdrawal from substances and denies any history of seizure.  He reports current withdrawal symptoms of fatigue, nausea, muscle aches and chills.  During interview with me today the patient denies SI, AI, PI, AH or VH.  On exam today he does not appear to attend or respond to internal stimuli.  Patient does report depressed "miserable" mood, trouble sleeping, low energy, problems concentrating and difficulty getting out of bed which have worsened in recent weeks/months. Appetite is okay.  There is no diaphoresis, rhinorrhea, lacrimation or piloerection observed on exam.  There is no tremor or other motor abnormality observable on exam today and vital signs are stable and within normal limits.  Patient showed me injection sites on antecubital fossa bilaterally.  There is no sign of abscess or infection at injection sites.  When I asked whether he is interested in residential substance abuse treatment, the patient states that it is just day 1 and it is too soon for him to decide.  Patient states that he has been sharing needles and he is receptive to STI testing including HIV testing.  Urine tox screen was positive for secobarbital, buprenorphine, cocaine, oxycodone and marijuana but not benzodiazepines.  I asked patient if he was  aware that he was taking barbiturates/secobarbital and he states that he was not.  Patient has been taking pills that he purchases off the street and he is uncertain of what is in them.  Review of PDMP shows no prescriptions reported to PDMP.  On interview  today patient denies any history of prior psychiatric diagnoses or treatments.  Information in chart notes indicates that he previously reported a history of schizophrenia and depression.  Patient denies any prior inpatient psychiatric hospitalizations during interview with me today but chart notes indicate he reported prior inpatient psychiatric treatment in [place].  He denies any history of suicide attempts.  He denies any history of nonsuicidal self-injurious behavior.  Patient reports that he is hep C positive.  He denies any history of other medical conditions including asthma, seizure, diabetes, hypertension.  He takes no medications.  He reports no known allergies.  Patient denies any family psychiatric history of mental health issues, alcohol or substance use issues or completed suicides.  Continued Clinical Symptoms:  Alcohol Use Disorder Identification Test Final Score (AUDIT): 20 The "Alcohol Use Disorders Identification Test", Guidelines for Use in Primary Care, Second Edition.  World Pharmacologist [facility]). Score between 0-7:  no or low risk or alcohol related problems. Score between 8-15:  moderate risk of alcohol related problems. Score between 16-19:  high risk of alcohol related problems. Score 20 or above:  warrants further diagnostic evaluation for alcohol dependence and treatment.   CLINICAL FACTORS:   Depression:   Anhedonia Comorbid alcohol abuse/dependence Impulsivity Insomnia Alcohol/Substance Abuse/Dependencies   Musculoskeletal: Strength & Muscle Tone: within normal limits Gait & Station: normal Patient leans: N/A  Psychiatric Specialty Exam:  Presentation  General Appearance: Appropriate for Environment  Eye Contact:Good  Speech:Normal Rate  Speech Volume:Decreased  Handedness:Right   Mood and Affect  Mood:Dysphoric; Depressed  Affect:Congruent   Thought Process  Thought Processes:Coherent  Descriptions of  Associations:Intact  Orientation:Full (Time, Place and Person)  Thought Content:Logical  History of Schizophrenia/Schizoaffective disorder:No data recorded Duration of Psychotic Symptoms:No data recorded Hallucinations:Hallucinations: None  Ideas of Reference:None  Suicidal Thoughts:Suicidal Thoughts: No  Homicidal Thoughts:Homicidal Thoughts: No   Sensorium  Memory:Immediate Good; Recent Good  Judgment:Fair  Insight:Good   Executive Functions  Concentration:Fair  Attention Span:Fair  [place]  Language:Good   Psychomotor Activity  Psychomotor Activity:Psychomotor Activity: Decreased   Assets  Assets:Communication Skills; Desire for Improvement; Housing; Social Support   Sleep  Sleep:Sleep: Poor Number of Hours of Sleep: 4    Physical Exam: Physical Exam Vitals and nursing note reviewed.  Constitutional:      General: He is not in acute distress.    Appearance: He is not diaphoretic.  HENT:     Head: Normocephalic and atraumatic.     Nose: No rhinorrhea.  Cardiovascular:     Rate and Rhythm: Normal rate.  Pulmonary:     Effort: Pulmonary effort is normal.  Neurological:     General: No focal deficit present.     Mental Status: He is alert and oriented to person, place, and time.     Motor: No tremor.   Review of Systems  Constitutional:  Positive for chills and malaise/fatigue. Negative for diaphoresis and fever.  HENT:  Negative for sore throat.   Respiratory:  Negative for cough and shortness of breath.   Cardiovascular:  Negative for chest pain and palpitations.  Gastrointestinal:  Positive for nausea. Negative for constipation, diarrhea and vomiting.  Musculoskeletal:  Positive for joint  pain and myalgias.  Skin: Negative.   Neurological:  Negative for dizziness, tremors, seizures and headaches.  Psychiatric/Behavioral:  Positive for depression and substance abuse. Negative for hallucinations and suicidal  ideas. The patient is nervous/anxious and has insomnia.   All other systems reviewed and are negative. Blood pressure 116/73, pulse 85, temperature 98.5 F (36.9 C), temperature source Oral, resp. rate 18, height _0  (1.803 m), weight 74.4 kg, SpO2 100 %. Body mass index is 22.87 kg/m.   COGNITIVE FEATURES THAT CONTRIBUTE TO RISK:  None    SUICIDE RISK:   Moderate:  Frequent suicidal ideation with limited intensity, and duration, some specificity in terms of plans, no associated intent, good self-control, limited dysphoria/symptomatology, some risk factors present, and identifiable protective factors, including available and accessible social support.  PLAN OF CARE: Patient is 30 year old male with history of polysubstance use disorder, depression, childhood trauma admitted after he reported worsening symptoms of depression and suicidal ideation in the context of job loss and increased substance use.  Patient is denying suicidal ideation today.  Current diagnosis is substance-induced mood disorder although cannot rule out underlying primary mood disorder at this time.  Patient also has polysubstance use disorder use of multiple substances including substances that patient is unaware he is using.  He has been continued on every 15-minute observation status.  He has been moved to a room close to the nurses station that has an adjustable medical bed.  Available lab results reviewed.  CMP showed AST of 62, ALT of 173 and otherwise WNL.  Lipid profile showed HDL of 26 and otherwise WNL.  CBC showed WBC of 11.1, RBC of 4.21, hemoglobin of 12.7, RDW of 15.9, platelets of 402, ANC of 7800 and otherwise WNL.  Hemoglobin A1c was 5.7.  TSH was mildly elevated at 5.733.  Free T4 was WNL at 0.94.  Influenza A, influenza B and coronavirus testing were negative.  BAL was not performed.  Urine tox screen was positive for secobarbital/barbiturates, buprenorphine, cocaine, oxycodone and marijuana but was not positive  for benzodiazepines.  EKG was not performed but has been ordered.  Patient has been started on COWS protocol for possible opioid withdrawal and CIWA protocol with scheduled Ativan taper and PRN Ativan for CIWA scores greater than 10 to cover for withdrawal from benzodiazepines and alcohol.  Carbamazepine 100 mg Q8H has been initiated to assist with detox from barbiturates.  Patient was unaware that he was taking secobarbital prior to admission and duration, frequency and amount of barbiturate use is unknown.  Both standing dose lorazepam and carbamazepine should help decrease the likelihood of withdrawal seizure.  See MAR for additional medications.  Have ordered additional lab work to be collected on the morning of 10/21/2020 including carbamazepine level, repeat CBC with differential, hepatic function panel, HIV testing and RPR.  Patient stated desire for HIV testing.  Estimated length of stay 5 to 7 days.  Patient will need referral to substance abuse treatment program and outpatient mental health therapist and psychiatrist at discharge.  Housing plan needs to be clarified.  I certify that inpatient services furnished can reasonably be expected to improve the patient's condition.   Arthor Captain, MD 10/19/2020, 12:32 PM

## 2020-10-19 NOTE — Tx Team (Signed)
Initial Treatment Plan 10/19/2020 4:52 AM [name] HGD:[id number]    PATIENT STRESSORS: Marital or family conflict Substance abuse   PATIENT STRENGTHS: Ability for insight Motivation for treatment/growth Supportive family/friends   PATIENT IDENTIFIED PROBLEMS: Substance abuse   Depression   (Self love and patience)                 DISCHARGE CRITERIA:  Ability to meet basic life and health needs Improved stabilization in mood, thinking, and/or behavior Verbal commitment to aftercare and medication compliance Withdrawal symptoms are absent or subacute and managed without 24-hour nursing intervention  PRELIMINARY DISCHARGE PLAN: Attend 12-step recovery group Outpatient therapy Return to previous living arrangement  PATIENT/FAMILY INVOLVEMENT: This treatment plan has been presented to and reviewed with the patient, Jacob Johns, and/or family member.  The patient and family have been given the opportunity to ask questions and make suggestions.  Mancel Bale, RN 10/19/2020, 4:52 AM
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
## 2020-10-19 NOTE — H&P (Addendum)
Psychiatric Admission Assessment Adult  Patient Identification: Jacob Johns MRN:  [id number] Date of Evaluation:  10/19/2020 Chief Complaint:  MDD (major depressive disorder), recurrent severe, without psychosis ([place]) [F33.2] Principal Diagnosis: Substance induced mood disorder ([place]) Diagnosis:  Principal Problem:   Substance induced mood disorder ([place]) Active Problems:   Polysubstance (including opioids) dependence, daily use (HCC)   Moderate opioid use disorder (HCC)   Moderate alcohol use disorder ([place])  History of Present Illness: Medical record reviewed.  Patient's case discussed in detail with staff and members of the treatment team.  I met with and evaluated the patient on the unit today.  Jacob Johns is a 30 year old male with a history of depression, substance use disorder (opiates, benzodiazepines, methamphetamine, marijuana, alcohol including IVDU), hep C positive and history of childhood trauma who presented voluntarily on 10/18/2020 to [facility] reporting worsening depression, increased drug use and suicidal ideation with a plan to overdose on drugs.  At [facility] patient reported a prior diagnosis of schizophrenia and endorsed symptoms of auditory hallucinations.  Patient reported that his symptoms had worsened since losing his job 2 months ago.  Notes from [facility] also document that patient stated he was prescribed trazodone and Zoloft but was not taking them because he did not like mixing them with his street drug use.  He was admitted to [facility] for crisis stabilization and treatment.  During evaluation with me this morning, patient appears tired and poorly engaged in our conversation.  He states that he came to the hospital because he was tired of using drugs and wants to stop using.  He endorsed daily use of fentanyl intravenously for the past 4 months and estimates use of 1 g/day.  Patient reports 2 accidental overdoses on fentanyl during the past month.  He endorsed daily use of Klonopin  (which he purchases off the street) for the past month and estimates that he takes 2 to 3 pills/day of unknown strength.  He consumes alcohol 2-3 times per week up to 5 shots of alcohol per day.  Patient also reports use of methamphetamine by smoking 2-3 times per months.  He denies cocaine use.  He denies barbiturate use.  Patient denies any history of DTs, complicated withdrawal from substances and denies any history of seizure.  He reports current withdrawal symptoms of fatigue, nausea, muscle aches and chills.  During interview with me today the patient denies SI, AI, PI, AH or VH.  On exam today he does not appear to attend or respond to internal stimuli.  Patient does report depressed "miserable" mood, trouble sleeping, low energy, problems concentrating and difficulty getting out of bed which have worsened in recent weeks/months. Appetite is okay.  There is no diaphoresis, rhinorrhea, lacrimation or piloerection observed on exam.  There is no tremor or other motor abnormality observable on exam today and vital signs are stable and within normal limits.  Patient showed me injection sites on antecubital fossa bilaterally.  There is no sign of abscess or infection at injection sites.  When I asked whether he is interested in residential substance abuse treatment, the patient states that it is just day 1 and it is too soon for him to decide.  Patient states that he has been sharing needles and he is receptive to STI testing including HIV testing.  Urine tox screen was positive for secobarbital, buprenorphine, cocaine, oxycodone and marijuana but not benzodiazepines.  I asked patient if he was aware that he was taking barbiturates/secobarbital and he states that  he was not.  Patient has been taking pills that he purchases off the street and he is uncertain of what is in them.  Review of PDMP shows no prescriptions reported to PDMP.  Patient reports that he is hep C positive.  He denies any history of other  medical conditions including asthma, seizure, diabetes, hypertension.  He takes no medications.  He reports no known allergies.    Associated Signs/Symptoms: Depression Symptoms:  depressed mood, insomnia, psychomotor retardation, feelings of worthlessness/guilt, difficulty concentrating, anxiety, loss of energy/fatigue, Duration of Depression Symptoms: Greater than two weeks  (Hypo) Manic Symptoms:  Distractibility, Impulsivity, Anxiety Symptoms:  Excessive Worry, Psychotic Symptoms:   denies PTSD Symptoms: Had a traumatic exposure:  history of childhood trauma Total Time spent with patient:  50 minutes  Past Psychiatric History: On interview today patient denies any history of prior psychiatric diagnoses or treatments.  Information in chart notes indicates that he previously reported a history of schizophrenia and depression.  Patient denies any prior inpatient psychiatric hospitalizations during interview with me today but chart notes indicate he reported prior inpatient psychiatric treatment in [place].  He denies any history of suicide attempts.  He denies any history of nonsuicidal self-injurious behavior.  Is the patient at risk to self? Yes.    Has the patient been a risk to self in the past 6 months? Yes.    Has the patient been a risk to self within the distant past? No.  Is the patient a risk to others? No.  Has the patient been a risk to others in the past 6 months? No.  Has the patient been a risk to others within the distant past? No.   Prior Inpatient Therapy:   Prior Outpatient Therapy:    Alcohol Screening: 1. How often do you have a drink containing alcohol?: 4 or more times a week 2. How many drinks containing alcohol do you have on a typical day when you are drinking?: 3 or 4 3. How often do you have six or more drinks on one occasion?: Daily or almost daily AUDIT-C Score: 9 4. How often during the last year have you found that you were not able to stop  drinking once you had started?: Daily or almost daily 5. How often during the last year have you failed to do what was normally expected from you because of drinking?: Daily or almost daily 6. How often during the last year have you needed a first drink in the morning to get yourself going after a heavy drinking session?: Less than monthly 7. How often during the last year have you had a feeling of guilt of remorse after drinking?: Less than monthly 8. How often during the last year have you been unable to remember what happened the night before because you had been drinking?: Less than monthly 9. Have you or someone else been injured as a result of your drinking?: No 10. Has a relative or friend or a doctor or another health worker been concerned about your drinking or suggested you cut down?: No Alcohol Use Disorder Identification Test Final Score (AUDIT): 20 Alcohol Brief Interventions/Follow-up: Patient Refused Substance Abuse History in the last 12 months:  Yes.   Consequences of Substance Abuse: Medical Consequences:  Hep C positive from sharing needles Legal Consequences:  history of jail time Withdrawal Symptoms:   Nausea Fatigue, muscle aches, joint aches, anxiety, chills Substance use has likely significantly contributed to patient's reported mood symptoms and is the  cause of current hospital admission. Previous Psychotropic Medications: Yes  Psychological Evaluations: Yes  Past Medical History: History reviewed. No pertinent past medical history. History reviewed. No pertinent surgical history. Family History: History reviewed. No pertinent family history. Family Psychiatric  History: Patient denies any family psychiatric history of mental health issues, alcohol or substance use issues or completed suicides. Tobacco Screening:   Social History:  Social History   Substance and Sexual Activity  Alcohol Use No     Social History   Substance and Sexual Activity  Drug Use Yes    Types: Marijuana, Cocaine    Additional Social History:                           Allergies:  No Known Allergies Lab Results:  Results for orders placed or performed during the hospital encounter of 10/18/20 (from the past 48 hour(s))  Resp Panel by RT-PCR (Flu A&B, Covid) Nasopharyngeal Swab     Status: None   Collection Time: 10/18/20 11:09 PM   Specimen: Nasopharyngeal Swab; Nasopharyngeal(NP) swabs in vial transport medium  Result Value Ref Range   SARS Coronavirus 2 by RT PCR NEGATIVE NEGATIVE    Comment: (NOTE) SARS-CoV-2 target nucleic acids are NOT DETECTED.  The SARS-CoV-2 RNA is generally detectable in upper respiratory specimens during the acute phase of infection. The lowest concentration of SARS-CoV-2 viral copies this assay can detect is 138 copies/mL. A negative result does not preclude SARS-Cov-2 infection and should not be used as the sole basis for treatment or other patient management decisions. A negative result may occur with  improper specimen collection/handling, submission of specimen other than nasopharyngeal swab, presence of viral mutation(s) within the areas targeted by this assay, and inadequate number of viral copies(<138 copies/mL). A negative result must be combined with clinical observations, patient history, and epidemiological information. The expected result is Negative.  Fact Sheet for Patients:  EntrepreneurPulse.com.au  Fact Sheet for Healthcare Providers:  IncredibleEmployment.be  This test is no t yet approved or cleared by the [place] FDA and  has been authorized for detection and/or diagnosis of SARS-CoV-2 by FDA under an Emergency Use Authorization (EUA). This EUA will remain  in effect (meaning this test can be used) for the duration of the COVID-19 declaration under Section 564(b)(1) of the Act, 21 U.S.C.section 360bbb-3(b)(1), unless the authorization is terminated  or revoked  sooner.       Influenza A by PCR NEGATIVE NEGATIVE   Influenza B by PCR NEGATIVE NEGATIVE    Comment: (NOTE) The Xpert Xpress SARS-CoV-2/FLU/RSV plus assay is intended as an aid in the diagnosis of influenza from Nasopharyngeal swab specimens and should not be used as a sole basis for treatment. Nasal washings and aspirates are unacceptable for Xpert Xpress SARS-CoV-2/FLU/RSV testing.  Fact Sheet for Patients: EntrepreneurPulse.com.au  Fact Sheet for Healthcare Providers: IncredibleEmployment.be  This test is not yet approved or cleared by the [place] FDA and has been authorized for detection and/or diagnosis of SARS-CoV-2 by FDA under an Emergency Use Authorization (EUA). This EUA will remain in effect (meaning this test can be used) for the duration of the COVID-19 declaration under Section 564(b)(1) of the Act, 21 U.S.C. section 360bbb-3(b)(1), unless the authorization is terminated or revoked.  Performed at [facility], [place] [address] [place], [place] [address]   CBC with Differential     Status: Abnormal   Collection Time: 10/18/20 11:10 PM  Result Value Ref  Range   WBC 11.1 (H) 4.0 - 10.5 K/uL   RBC 4.21 (L) 4.22 - 5.81 MIL/uL   Hemoglobin 12.7 (L) 13.0 - 17.0 g/dL   HCT 39.4 39.0 - 52.0 %   MCV 93.6 80.0 - 100.0 fL   MCH 30.2 26.0 - 34.0 pg   MCHC 32.2 30.0 - 36.0 g/dL   RDW 15.9 (H) 11.5 - 15.5 %   Platelets 402 (H) 150 - 400 K/uL   nRBC 0.0 0.0 - 0.2 %   Neutrophils Relative % 70 %   Neutro Abs 7.8 (H) 1.7 - 7.7 K/uL   Lymphocytes Relative 23 %   Lymphs Abs 2.5 0.7 - 4.0 K/uL   Monocytes Relative 6 %   Monocytes Absolute 0.6 0.1 - 1.0 K/uL   Eosinophils Relative 1 %   Eosinophils Absolute 0.1 0.0 - 0.5 K/uL   Basophils Relative 0 %   Basophils Absolute 0.0 0.0 - 0.1 K/uL   Immature Granulocytes 0 %   Abs Immature Granulocytes 0.04 0.00 - 0.07 K/uL    Comment: Performed at [facility], [address], [place], [place] [address]  Comprehensive metabolic panel     Status: Abnormal   Collection Time: 10/18/20 11:10 PM  Result Value Ref Range   Sodium 140 135 - 145 mmol/L   Potassium 4.3 3.5 - 5.1 mmol/L   Chloride 102 98 - 111 mmol/L   CO2 30 22 - 32 mmol/L   Glucose, Bld 84 70 - 99 mg/dL    Comment: Glucose reference range applies only to samples taken after fasting for at least 8 hours.   BUN 16 6 - 20 mg/dL   Creatinine, Ser 0.93 0.61 - 1.24 mg/dL   Calcium 9.5 8.9 - 10.3 mg/dL   Total Protein 7.7 6.5 - 8.1 g/dL   Albumin 3.6 3.5 - 5.0 g/dL   AST 62 (H) 15 - 41 U/L   ALT 173 (H) 0 - 44 U/L   Alkaline Phosphatase 120 38 - 126 U/L   Total Bilirubin 0.4 0.3 - 1.2 mg/dL   GFR, Estimated >60 >60 mL/min    Comment: (NOTE) Calculated using the CKD-EPI Creatinine Equation (2021)    Anion gap 8 5 - 15    Comment: Performed at [facility] [address], [place], [place] [address]  TSH     Status: Abnormal   Collection Time: 10/18/20 11:10 PM  Result Value Ref Range   TSH 5.733 (H) 0.350 - 4.500 uIU/mL    Comment: Performed by a 3rd Generation assay with a functional sensitivity of <=0.01 uIU/mL. Performed at [facility], [place] [address], [place],  [address]   Lipid panel     Status: Abnormal   Collection Time: 10/18/20 11:10 PM  Result Value Ref Range   Cholesterol 66 0 - 200 mg/dL   Triglycerides 49 <150 mg/dL   HDL 26 (L) >40 mg/dL   Total CHOL/HDL Ratio 2.5 RATIO   VLDL 10 0 - 40 mg/dL   LDL Cholesterol 30 0 - 99 mg/dL    Comment:        Total Cholesterol/HDL:CHD Risk Coronary Heart Disease Risk Table                     Men   Women  1/2 Average Risk   3.4   3.3  Average Risk       5.0   4.4  2 X Average Risk  9.6   7.1  3 X Average Risk  23.4   11.0        Use the calculated Patient Ratio above and the CHD Risk Table to determine the patient's CHD Risk.        ATP III CLASSIFICATION (LDL):  <100     mg/dL   Optimal  100-129  mg/dL   Near  or Above                    Optimal  130-159  mg/dL   Borderline  160-189  mg/dL   High  >190     mg/dL   Very High Performed at [facility] [address], [place], [place] [address]   Hemoglobin A1c     Status: Abnormal   Collection Time: 10/18/20 11:10 PM  Result Value Ref Range   Hgb A1c MFr Bld 5.7 (H) 4.8 - 5.6 %    Comment: (NOTE) Pre diabetes:          5.7%-6.4%  Diabetes:              >6.4%  Glycemic control for   <7.0% adults with diabetes    Mean Plasma Glucose 116.89 mg/dL    Comment: Performed at [facility] [address] [place], [place] [address]  T4, free     Status: None   Collection Time: 10/18/20 11:10 PM  Result Value Ref Range   Free T4 0.94 0.61 - 1.12 ng/dL    Comment: (NOTE) Biotin ingestion may interfere with free T4 tests. If the results are inconsistent with the TSH level, previous test results, or the clinical presentation, then consider biotin interference. If needed, order repeat testing after stopping biotin. Performed at [facility], [place] [address], [place],  [address]   POCT Urine Drug Screen - ([facility])     Status: Abnormal   Collection Time: 10/18/20 11:21 PM  Result Value Ref Range   POC Amphetamine UR None Detected NONE DETECTED (Cut Off Level 1000 ng/mL)   POC Secobarbital (BAR) Positive (A) NONE DETECTED (Cut Off Level 300 ng/mL)   POC Buprenorphine (BUP) Positive (A) NONE DETECTED (Cut Off Level 10 ng/mL)   POC Oxazepam (BZO) None Detected NONE DETECTED (Cut Off Level 300 ng/mL)   POC Cocaine UR Positive (A) NONE DETECTED (Cut Off Level 300 ng/mL)   POC Methamphetamine UR None Detected NONE DETECTED (Cut Off Level 1000 ng/mL)   POC Morphine None Detected NONE DETECTED (Cut Off Level 300 ng/mL)   POC Oxycodone UR Positive (A) NONE DETECTED (Cut Off Level 100 ng/mL)   POC Methadone UR None Detected NONE DETECTED (Cut Off Level 300 ng/mL)   POC Marijuana UR Positive (A) NONE DETECTED (Cut Off Level 50  ng/mL)    Blood Alcohol level:  Lab Results  Component Value Date   ETH 108 (H) 10/23/2018   ETH <11 94/85/4627    Metabolic Disorder Labs:  Lab Results  Component Value Date   HGBA1C 5.7 (H) 10/18/2020   MPG 116.89 10/18/2020   No results found for: PROLACTIN Lab Results  Component Value Date   CHOL 66 10/18/2020   TRIG 49 10/18/2020   HDL 26 (L) 10/18/2020   CHOLHDL 2.5 10/18/2020   VLDL 10 10/18/2020   LDLCALC 30 10/18/2020    Current Medications: Current Facility-Administered Medications  Medication Dose Route Frequency Provider Last Rate Last Admin   acetaminophen (TYLENOL) tablet 650 mg  650 mg Oral  Q6H PRN Prescilla Sours, PA-C   650 mg at 10/19/20 3295   alum & mag hydroxide-simeth (MAALOX/MYLANTA) 200-200-20 MG/5ML suspension 30 mL  30 mL Oral Q4H PRN Margorie John W, PA-C       carbamazepine (TEGRETOL) chewable tablet 100 mg  100 mg Oral Q8H Arthor Captain, MD   100 mg at 10/19/20 1315   dicyclomine (BENTYL) tablet 20 mg  20 mg Oral Q6H PRN Prescilla Sours, PA-C       feeding supplement (ENSURE ENLIVE / ENSURE PLUS) liquid 237 mL  237 mL Oral BID BM Nelda Marseille, Amy E, MD   237 mL at 10/19/20 0944   hydrOXYzine (ATARAX/VISTARIL) tablet 25 mg  25 mg Oral Q6H PRN Prescilla Sours, PA-C   25 mg at 10/19/20 1884   loperamide (IMODIUM) capsule 2-4 mg  2-4 mg Oral PRN Margorie John W, PA-C       LORazepam (ATIVAN) tablet 1 mg  1 mg Oral QID Margorie John W, PA-C   1 mg at 10/19/20 1142   Followed by   Derrill Memo ON 10/20/2020] LORazepam (ATIVAN) tablet 1 mg  1 mg Oral TID Prescilla Sours, PA-C       Followed by   Derrill Memo ON 10/21/2020] LORazepam (ATIVAN) tablet 1 mg  1 mg Oral BID Margorie John W, PA-C       Followed by   Derrill Memo ON 10/22/2020] LORazepam (ATIVAN) tablet 1 mg  1 mg Oral Daily Lovena Le, Cody W, PA-C       magnesium hydroxide (MILK OF MAGNESIA) suspension 30 mL  30 mL Oral Daily PRN Margorie John W, PA-C       methocarbamol (ROBAXIN) tablet 500 mg  500 mg Oral Q8H PRN Margorie John W, PA-C       multivitamin with minerals tablet 1 tablet  1 tablet Oral Daily Margorie John W, PA-C   1 tablet at 10/19/20 1660   naproxen (NAPROSYN) tablet 500 mg  500 mg Oral BID PRN Margorie John W, PA-C       nicotine (NICODERM CQ - dosed in mg/24 hours) patch 14 mg  14 mg Transdermal Daily Margorie John W, PA-C   14 mg at 10/19/20 0936   ondansetron (ZOFRAN-ODT) disintegrating tablet 4 mg  4 mg Oral Q6H PRN Prescilla Sours, PA-C   4 mg at 10/19/20 1314   [START ON 10/20/2020] thiamine tablet 100 mg  100 mg Oral Daily Margorie John W, PA-C       traZODone (DESYREL) tablet 50 mg  50 mg Oral QHS PRN Prescilla Sours, PA-C       PTA Medications: Medications Prior to Admission  Medication Sig Dispense Refill Last Dose   naloxone (NARCAN) nasal spray 4 mg/0.1 mL In case of an overdose, squirt into your nostril (Patient not taking: Reported on 10/19/2020) 1 each 0 Not Taking    Musculoskeletal: Strength & Muscle Tone: within normal limits Gait & Station: normal Patient leans: N/A            Psychiatric Specialty Exam:  Presentation  General Appearance: Appropriate for Environment  Eye Contact:Good  Speech:Normal Rate  Speech Volume:Decreased  Handedness:Right   Mood and Affect  Mood:Dysphoric; Depressed  Affect:Congruent   Thought Process  Thought Processes:Coherent  Duration of Psychotic Symptoms: No data recorded Past Diagnosis of Schizophrenia or Psychoactive disorder: No data recorded Descriptions of Associations:Intact  Orientation:Full (Time, Place and Person)  Thought Content:Logical  Hallucinations:Hallucinations: None  Ideas of Reference:None  Suicidal  Thoughts:Suicidal Thoughts: No  Homicidal Thoughts:Homicidal Thoughts: No   Sensorium  Memory:Immediate Good; Recent Good  Judgment:Fair  Insight:Good   Executive Functions  Concentration:Fair  Attention Span:Fair  [place]  Language:Good   Psychomotor Activity  Psychomotor Activity:Psychomotor Activity: Decreased   Assets  Assets:Communication Skills; Desire for Improvement; Housing; Social Support   Sleep  Sleep:Sleep: Poor Number of Hours of Sleep: 4    Physical Exam: Physical Exam Vitals and nursing note reviewed.  Constitutional:      General: He is not in acute distress.    Appearance: He is not diaphoretic.  HENT:     Head: Normocephalic and atraumatic.     Nose: No rhinorrhea.  Cardiovascular:     Rate and Rhythm: Normal rate.  Pulmonary:     Effort: Pulmonary effort is normal.  Skin:    General: Skin is warm and dry.  No signs of infection noted at injection sites. Neurological:     General: No focal deficit present.     Mental Status: He is alert and oriented to person, place, and time.     Motor: No tremor.  ROS Constitutional:  Positive for chills and malaise/fatigue. Negative for diaphoresis and fever.  HENT:  Negative for sore throat.   Respiratory:  Negative for cough and shortness of breath.   Cardiovascular:  Negative for chest pain and palpitations.  Gastrointestinal:  Positive for nausea. Negative for constipation, diarrhea and vomiting.  Musculoskeletal:  Positive for joint pain and myalgias.  Skin: Negative.   Neurological:  Negative for dizziness, tremors, seizures and headaches.  Psychiatric/Behavioral:  Positive for depression and substance abuse. Negative for hallucinations and suicidal ideas. The patient is nervous/anxious and has insomnia.   All other systems reviewed and are negative.  Blood pressure 116/73, pulse 85, temperature 98.5 F (36.9 C), temperature source Oral, resp. rate 18, height _0  (1.803 m), weight 74.4 kg, SpO2 100 %. Body mass index is 22.87 kg/m.  Treatment Plan Summary: Patient is 30 year old male with history of polysubstance use disorder, depression, childhood trauma admitted due to worsening symptoms of  depression and suicidal ideation in the context of job loss and increased substance use.  Patient is denying suicidal ideation today.  Current diagnosis is substance-induced mood disorder although cannot rule out underlying primary mood disorder at this time.  Patient also has polysubstance use disorder with use of multiple substances including substances that patient is unaware he is using (barbiturate/secobarbital).  Patient endorses recent daily use of fentanyl/opioids, benzodiazepines/Klonopin, alcohol, methamphetamine, marijuana and drug screen is also positive for barbiturates/secobarbital.    Daily contact with patient to assess and evaluate symptoms and progress in treatment and Medication management  Observation Level/Precautions:  Detox 15 minute checks  Laboratory:  CBC Chemistry Profile HbAIC UDS TSH, Free T4, lipid panel, HIV, RPR Available lab results reviewed.  CMP showed AST of 62, ALT of 173 and otherwise WNL.  Lipid profile showed HDL of 26 and otherwise WNL.  CBC showed WBC of 11.1, RBC of 4.21, hemoglobin of 12.7, RDW of 15.9, platelets of 402, ANC of 7800 and otherwise WNL.  Hemoglobin A1c was 5.7.  TSH was mildly elevated at 5.733.  Free T4 was WNL at 0.94.  Influenza A, influenza B and coronavirus testing were negative.  BAL was not performed.  Urine tox screen was positive for secobarbital/barbiturates, buprenorphine, cocaine, oxycodone and marijuana but was not positive for benzodiazepines.  Liver enzyme elevation likely due to hep C infection and  recent alcohol and substance use.  Have ordered repeat CMP, CBC with differential and Tegretol level to be drawn on the morning of 10/21/2020.  Have also ordered HIV testing and RPR to be collected on 10/21/2020.  EKG performed today showed normal sinus rhythm, ventricular rate of 92 and QT/QTc of 366/452.  Psychotherapy:    Medications: Patient has been started on COWS protocol for possible opioid withdrawal and CIWA protocol with  scheduled Ativan taper and PRN Ativan for CIWA scores greater than 10 to cover for withdrawal from benzodiazepines and alcohol.  Carbamazepine 100 mg Q8H has been initiated to assist with detox from barbiturates.  Patient was unaware that he was taking secobarbital prior to admission and duration, frequency and amount of barbiturate use is unknown.  Both standing dose lorazepam and carbamazepine should help decrease the likelihood of withdrawal seizure.  See MAR for additional medications.  Consultations:    Discharge Concerns:    Estimated LOS: 5 to 7 days  Other:  Patient will need referral to substance abuse treatment program and outpatient mental health therapist and psychiatrist at discharge.  Housing plan needs to be clarified.   Physician Treatment Plan for Primary Diagnosis: Substance induced mood disorder ([place]) Long Term Goal(s): Improvement in symptoms so as ready for discharge  Short Term Goals: Ability to identify changes in lifestyle to reduce recurrence of condition will improve, Ability to verbalize feelings will improve, Ability to disclose and discuss suicidal ideas, Ability to demonstrate self-control will improve, Ability to identify and develop effective coping behaviors will improve, Ability to maintain clinical measurements within normal limits will improve, Compliance with prescribed medications will improve, and Ability to identify triggers associated with substance abuse/mental health issues will improve  Physician Treatment Plan for Secondary Diagnosis: Principal Problem:   Substance induced mood disorder (HCC) Active Problems:   Polysubstance (including opioids) dependence, daily use (HCC)   Moderate opioid use disorder (HCC)   Moderate alcohol use disorder ([place])  Long Term Goal(s): Improvement in symptoms so as ready for discharge  Short Term Goals: Ability to identify changes in lifestyle to reduce recurrence of condition will improve, Ability to verbalize feelings  will improve, Ability to disclose and discuss suicidal ideas, Ability to demonstrate self-control will improve, Ability to identify and develop effective coping behaviors will improve, Ability to maintain clinical measurements within normal limits will improve, Compliance with prescribed medications will improve, and Ability to identify triggers associated with substance abuse/mental health issues will improve  I certify that inpatient services furnished can reasonably be expected to improve the patient's condition.    Arthor Captain, MD 8/19/20222:26 PM

## 2020-10-19 NOTE — [facility] Group Notes (Signed)
Type of Therapy and Topic: Group Therapy: Anger Management   Participation Level:  Did Not Attend  Description of Group: In this group, patients will learn helpful strategies and techniques to manage anger, express anger in alternative ways, change hostile attitudes, and prevent aggressive acts, such as verbal abuse and violence.This group will be process-oriented and eductional, with patients participating in exploration of their own experiences as well as giving and receiving support and challenge from other group members.  Therapeutic Goals: Patient will learn to manage anger. Patient will learn to stop violence or the threat of violence. Patient will learn to develop self control over thoughts and actions. Patient will receive support and feedback from others  Summary of Patient Progress: Did not attend

## 2020-10-20 DIAGNOSIS — F1994 Other psychoactive substance use, unspecified with psychoactive substance-induced mood disorder: Secondary | ICD-10-CM | POA: Diagnosis not present

## 2020-10-20 LAB — T3, FREE: T3, Free: 3.6 pg/mL (ref 2.0–4.4)

## 2020-10-20 NOTE — Progress Notes (Signed)
[facility] Group Notes:  (Nursing/MHT/Case Management/Adjunct)  Date:  10/20/2020  Time:  2000 Type of Therapy:   wrap up group  Participation Level:  Active  Participation Quality:  Appropriate, Attentive, Sharing, and Supportive  Affect:  Appropriate  Cognitive:  Alert  Insight:  Improving  Engagement in Group:  Engaged  Modes of Intervention:  Clarification, Education, and Support  Summary of Progress/Problems: Positive thinking and positive change were discussed.   [name] 10/20/2020, 9:16 PM

## 2020-10-20 NOTE — Progress Notes (Signed)
[facility] MD Progress Note  10/20/2020 5:03 PM Jacob Johns  MRN:  [id number] Subjective:  "I feel a little stronger today than yesterday"   Objective: Jacob Johns is a 30 year old male with a history of depression, substance use disorder (opiates, benzodiazepines, methamphetamine, marijuana, alcohol including IVDU), hep C positive and history of childhood trauma who presented voluntarily on 10/18/2020 to [facility] reporting worsening depression, increased drug use and suicidal ideation with a plan to overdose on drugs. At [facility] patient reported a prior diagnosis of schizophrenia and endorsed symptoms of auditory hallucinations. Patient reported that his symptoms had worsened since losing his job 2 months ago.  Notes from [facility] also document that patient stated he was prescribed trazodone and Zoloft but was not taking them because he did not like mixing them with his street drug use.  He was admitted to [facility] for crisis stabilization and treatment.  Evaluation on the unit:  Patient was seen and evaluated, chart reviewed and case discussed with the treatment team. Patient stated he feels a little better today. He stated his goal for treatment is to "gain strength." He stated he has a baby due on 8/25 and his "baby momma" is in [place]. He stated he has been using IV fentanyl, I gram a day since he got out of prison the end of May. He stated he wants to be clean so he can see his baby when she is born. He stated he has been to rehab numerous times and is thinking he wants to go live with his sister and get a job when he is discharged. He stated he wants to be productive and earn money to help take care of his baby. He stated he slept okay last night, record reflects he slept 5.5 hours. He stated his appetite is okay, improving since he was admitted. He stated he is going to take a shower today. He denies SI/HI/AVH, paranoia and delusions. He denies drug cravings. He is taking his medications as prescribed. His CIWA score  today was 0 and his COWS score at 0642 was 6. He is currently not on an antidepressant, will defer starting one until he has had a chance to clear some of the substances he has been abusing. He denies withdrawal symptoms except for feeling cold and weak. There is no evidence of infection on his bilateral antecubital injection sites. He is soft spoken and appears to be anxious. He did not attend group this morning but stated he is going to try to go today. Labs ordered for morning of 8/21 are HIV, Hepatic function, RPR , CBC with diff and CMP. Will continue to monitor for withdrawal symptoms. Patient is able to contract for safety on the unit.    Principal Problem: Substance induced mood disorder (HCC) Diagnosis: Principal Problem:   Substance induced mood disorder (HCC) Active Problems:   Polysubstance (including opioids) dependence, daily use (HCC)   Moderate opioid use disorder (HCC)   Moderate alcohol use disorder (HCC)  Total Time spent with patient: 20 minutes  Past Psychiatric History: Patient denies any history of prior psychiatric diagnoses or treatments.  Information in chart notes indicates that he previously reported a history of schizophrenia and depression.  Patient denies any prior inpatient psychiatric hospitalizations but chart notes indicate he reported prior inpatient psychiatric treatment in [place].  He denies any history of suicide attempts.  He denies any history of nonsuicidal self-injurious behavior  Past Medical History: History reviewed. No pertinent past medical history. History reviewed.  No pertinent surgical history. Family History: History reviewed. No pertinent family history. Family Psychiatric  History: Patient denies any family psychiatric history of mental health issues, alcohol or substance use issues or completed suicides. Social History:  Social History   Substance and Sexual Activity  Alcohol Use No     Social History   Substance and Sexual  Activity  Drug Use Yes   Types: Marijuana, Cocaine    Social History   Socioeconomic History   Marital status: Single    Spouse name: Not on file   Number of children: Not on file   Years of education: Not on file   Highest education level: Not on file  Occupational History   Not on file  Tobacco Use   Smoking status: Every Day    Packs/day: 1.00    Types: Cigarettes   Smokeless tobacco: Never  Substance and Sexual Activity   Alcohol use: No   Drug use: Yes    Types: Marijuana, Cocaine   Sexual activity: Not on file  Other Topics Concern   Not on file  Social History Narrative   Not on file   Social Determinants of Health   Financial Resource Strain: Not on file  Food Insecurity: Not on file  Transportation Needs: Not on file  Physical Activity: Not on file  Stress: Not on file  Social Connections: Not on file   Additional Social History:                         Sleep: Fair  Appetite:  Fair  Current Medications: Current Facility-Administered Medications  Medication Dose Route Frequency Provider Last Rate Last Admin   acetaminophen (TYLENOL) tablet 650 mg  650 mg Oral Q6H PRN Jaclyn Shaggy, PA-C   650 mg at 10/20/20 1603   alum & mag hydroxide-simeth (MAALOX/MYLANTA) 200-200-20 MG/5ML suspension 30 mL  30 mL Oral Q4H PRN Melbourne Abts W, PA-C       carbamazepine (TEGRETOL) chewable tablet 100 mg  100 mg Oral Q8H Claudie Revering, MD   100 mg at 10/20/20 1424   dicyclomine (BENTYL) tablet 20 mg  20 mg Oral Q6H PRN Melbourne Abts W, PA-C       feeding supplement (ENSURE ENLIVE / ENSURE PLUS) liquid 237 mL  237 mL Oral BID BM Mason Jim, Amy E, MD   237 mL at 10/20/20 1424   hydrOXYzine (ATARAX/VISTARIL) tablet 25 mg  25 mg Oral Q6H PRN Jaclyn Shaggy, PA-C   25 mg at 10/19/20 0936   loperamide (IMODIUM) capsule 2-4 mg  2-4 mg Oral PRN Jaclyn Shaggy, PA-C       [START ON 10/21/2020] LORazepam (ATIVAN) tablet 1 mg  1 mg Oral BID Melbourne Abts W, PA-C        Followed by   Melene Muller ON 10/22/2020] LORazepam (ATIVAN) tablet 1 mg  1 mg Oral Daily Ladona Ridgel, Cody W, PA-C       magnesium hydroxide (MILK OF MAGNESIA) suspension 30 mL  30 mL Oral Daily PRN Melbourne Abts W, PA-C       methocarbamol (ROBAXIN) tablet 500 mg  500 mg Oral Q8H PRN Melbourne Abts W, PA-C   500 mg at 10/20/20 1051   multivitamin with minerals tablet 1 tablet  1 tablet Oral Daily Jaclyn Shaggy, PA-C   1 tablet at 10/20/20 0839   naproxen (NAPROSYN) tablet 500 mg  500 mg Oral BID PRN Jaclyn Shaggy, PA-C   500  mg at 10/20/20 1228   nicotine (NICODERM CQ - dosed in mg/24 hours) patch 14 mg  14 mg Transdermal Daily Jaclyn Shaggy, PA-C   14 mg at 10/20/20 0840   ondansetron (ZOFRAN-ODT) disintegrating tablet 4 mg  4 mg Oral Q6H PRN Jaclyn Shaggy, PA-C   4 mg at 10/20/20 5176   thiamine tablet 100 mg  100 mg Oral Daily Jaclyn Shaggy, PA-C   100 mg at 10/20/20 0840   traZODone (DESYREL) tablet 50 mg  50 mg Oral QHS PRN Jaclyn Shaggy, PA-C   50 mg at 10/20/20 1607    Lab Results:  Results for orders placed or performed during the hospital encounter of 10/18/20 (from the past 48 hour(s))  Resp Panel by RT-PCR (Flu A&B, Covid) Nasopharyngeal Swab     Status: None   Collection Time: 10/18/20 11:09 PM   Specimen: Nasopharyngeal Swab; Nasopharyngeal(NP) swabs in vial transport medium  Result Value Ref Range   SARS Coronavirus 2 by RT PCR NEGATIVE NEGATIVE    Comment: (NOTE) SARS-CoV-2 target nucleic acids are NOT DETECTED.  The SARS-CoV-2 RNA is generally detectable in upper respiratory specimens during the acute phase of infection. The lowest concentration of SARS-CoV-2 viral copies this assay can detect is 138 copies/mL. A negative result does not preclude SARS-Cov-2 infection and should not be used as the sole basis for treatment or other patient management decisions. A negative result may occur with  improper specimen collection/handling, submission of specimen other than nasopharyngeal  swab, presence of viral mutation(s) within the areas targeted by this assay, and inadequate number of viral copies(<138 copies/mL). A negative result must be combined with clinical observations, patient history, and epidemiological information. The expected result is Negative.  Fact Sheet for Patients:  BloggerCourse.com  Fact Sheet for Healthcare Providers:  SeriousBroker.it  This test is no t yet approved or cleared by the [place] FDA and  has been authorized for detection and/or diagnosis of SARS-CoV-2 by FDA under an Emergency Use Authorization (EUA). This EUA will remain  in effect (meaning this test can be used) for the duration of the COVID-19 declaration under Section 564(b)(1) of the Act, 21 U.S.C.section 360bbb-3(b)(1), unless the authorization is terminated  or revoked sooner.       Influenza A by PCR NEGATIVE NEGATIVE   Influenza B by PCR NEGATIVE NEGATIVE    Comment: (NOTE) The Xpert Xpress SARS-CoV-2/FLU/RSV plus assay is intended as an aid in the diagnosis of influenza from Nasopharyngeal swab specimens and should not be used as a sole basis for treatment. Nasal washings and aspirates are unacceptable for Xpert Xpress SARS-CoV-2/FLU/RSV testing.  Fact Sheet for Patients: BloggerCourse.com  Fact Sheet for Healthcare Providers: SeriousBroker.it  This test is not yet approved or cleared by the [place] FDA and has been authorized for detection and/or diagnosis of SARS-CoV-2 by FDA under an Emergency Use Authorization (EUA). This EUA will remain in effect (meaning this test can be used) for the duration of the COVID-19 declaration under Section 564(b)(1) of the Act, 21 U.S.C. section 360bbb-3(b)(1), unless the authorization is terminated or revoked.  Performed at [facility], [address], [place], [place] [address]   CBC with Differential      Status: Abnormal   Collection Time: 10/18/20 11:10 PM  Result Value Ref Range   WBC 11.1 (H) 4.0 - 10.5 K/uL   RBC 4.21 (L) 4.22 - 5.81 MIL/uL   Hemoglobin 12.7 (L) 13.0 - 17.0 g/dL  HCT 39.4 39.0 - 52.0 %   MCV 93.6 80.0 - 100.0 fL   MCH 30.2 26.0 - 34.0 pg   MCHC 32.2 30.0 - 36.0 g/dL   RDW 16.1 (H) 09.6 - 04.5 %   Platelets 402 (H) 150 - 400 K/uL   nRBC 0.0 0.0 - 0.2 %   Neutrophils Relative % 70 %   Neutro Abs 7.8 (H) 1.7 - 7.7 K/uL   Lymphocytes Relative 23 %   Lymphs Abs 2.5 0.7 - 4.0 K/uL   Monocytes Relative 6 %   Monocytes Absolute 0.6 0.1 - 1.0 K/uL   Eosinophils Relative 1 %   Eosinophils Absolute 0.1 0.0 - 0.5 K/uL   Basophils Relative 0 %   Basophils Absolute 0.0 0.0 - 0.1 K/uL   Immature Granulocytes 0 %   Abs Immature Granulocytes 0.04 0.00 - 0.07 K/uL    Comment: Performed at [facility], [address] [place], [place] [address]  Comprehensive metabolic panel     Status: Abnormal   Collection Time: 10/18/20 11:10 PM  Result Value Ref Range   Sodium 140 135 - 145 mmol/L   Potassium 4.3 3.5 - 5.1 mmol/L   Chloride 102 98 - 111 mmol/L   CO2 30 22 - 32 mmol/L   Glucose, Bld 84 70 - 99 mg/dL    Comment: Glucose reference range applies only to samples taken after fasting for at least 8 hours.   BUN 16 6 - 20 mg/dL   Creatinine, Ser 1.91 0.61 - 1.24 mg/dL   Calcium 9.5 8.9 - 47.8 mg/dL   Total Protein 7.7 6.5 - 8.1 g/dL   Albumin 3.6 3.5 - 5.0 g/dL   AST 62 (H) 15 - 41 U/L   ALT 173 (H) 0 - 44 U/L   Alkaline Phosphatase 120 38 - 126 U/L   Total Bilirubin 0.4 0.3 - 1.2 mg/dL   GFR, Estimated >29 >56 mL/min    Comment: (NOTE) Calculated using the CKD-EPI Creatinine Equation (2021)    Anion gap 8 5 - 15    Comment: Performed at [facility], [address], [place], [place] [address]  TSH     Status: Abnormal   Collection Time: 10/18/20 11:10 PM  Result Value Ref Range   TSH 5.733 (H) 0.350 - 4.500 uIU/mL    Comment: Performed by a 3rd  Generation assay with a functional sensitivity of <=0.01 uIU/mL. Performed at [facility], [address], [place], [place] [address]   Lipid panel     Status: Abnormal   Collection Time: 10/18/20 11:10 PM  Result Value Ref Range   Cholesterol 66 0 - 200 mg/dL   Triglycerides 49 <696 mg/dL   HDL 26 (L) >29 mg/dL   Total CHOL/HDL Ratio 2.5 RATIO   VLDL 10 0 - 40 mg/dL   LDL Cholesterol 30 0 - 99 mg/dL    Comment:        Total Cholesterol/HDL:CHD Risk Coronary Heart Disease Risk Table                     Men   Women  1/2 Average Risk   3.4   3.3  Average Risk       5.0   4.4  2 X Average Risk   9.6   7.1  3 X Average Risk  23.4   11.0        Use the calculated Patient Ratio above and the  CHD Risk Table to determine the patient's CHD Risk.        ATP III CLASSIFICATION (LDL):  <100     mg/dL   Optimal  161-096  mg/dL   Near or Above                    Optimal  130-159  mg/dL   Borderline  045-409  mg/dL   High  >811     mg/dL   Very High Performed at  [facility], [address], [place], [place] [address]   Hemoglobin A1c     Status: Abnormal   Collection Time: 10/18/20 11:10 PM  Result Value Ref Range   Hgb A1c MFr Bld 5.7 (H) 4.8 - 5.6 %    Comment: (NOTE) Pre diabetes:          5.7%-6.4%  Diabetes:              >6.4%  Glycemic control for   <7.0% adults with diabetes    Mean Plasma Glucose 116.89 mg/dL    Comment: Performed at [facility], [address] [place], [place] [address]  T4, free     Status: None   Collection Time: 10/18/20 11:10 PM  Result Value Ref Range   Free T4 0.94 0.61 - 1.12 ng/dL    Comment: (NOTE) Biotin ingestion may interfere with free T4 tests. If the results are inconsistent with the TSH level, previous test results, or the clinical presentation, then consider biotin interference. If needed, order repeat testing after stopping biotin. Performed at [facility], [address] [place], [place] [address]    T3, free     Status: None   Collection Time: 10/18/20 11:10 PM  Result Value Ref Range   T3, Free 3.6 2.0 - 4.4 pg/mL    Comment: (NOTE) Performed At: [facility] [address] [place], [place] [id number] Jolene Schimke MD BM:[id number]   POCT Urine Drug Screen - (ICup)     Status: Abnormal   Collection Time: 10/18/20 11:21 PM  Result Value Ref Range   POC Amphetamine UR None Detected NONE DETECTED (Cut Off Level 1000 ng/mL)   POC Secobarbital (BAR) Positive (A) NONE DETECTED (Cut Off Level 300 ng/mL)   POC Buprenorphine (BUP) Positive (A) NONE DETECTED (Cut Off Level 10 ng/mL)   POC Oxazepam (BZO) None Detected NONE DETECTED (Cut Off Level 300 ng/mL)   POC Cocaine UR Positive (A) NONE DETECTED (Cut Off Level 300 ng/mL)   POC Methamphetamine UR None Detected NONE DETECTED (Cut Off Level 1000 ng/mL)   POC Morphine None Detected NONE DETECTED (Cut Off Level 300 ng/mL)   POC Oxycodone UR Positive (A) NONE DETECTED (Cut Off Level 100 ng/mL)   POC Methadone UR None Detected NONE DETECTED (Cut Off Level 300 ng/mL)   POC Marijuana UR Positive (A) NONE DETECTED (Cut Off Level 50 ng/mL)    Blood Alcohol level:  Lab Results  Component Value Date   ETH 108 (H) 10/23/2018   ETH <11 04/07/2013    Metabolic Disorder Labs: Lab Results  Component Value Date   HGBA1C 5.7 (H) 10/18/2020   MPG 116.89 10/18/2020   No results found for: PROLACTIN Lab Results  Component Value Date   CHOL 66 10/18/2020   TRIG 49 10/18/2020   HDL 26 (L) 10/18/2020   CHOLHDL 2.5 10/18/2020   VLDL 10 10/18/2020   LDLCALC 30 10/18/2020    Physical Findings: AIMS:  , ,  ,  ,  CIWA:  CIWA-Ar Total: 0 COWS:  COWS Total Score: 0  Musculoskeletal: Strength & Muscle Tone: within normal limits Gait & Station: normal Patient leans: N/A  Psychiatric Specialty Exam:  Presentation  General Appearance: Appropriate for Environment  Eye Contact:Good  Speech:Normal Rate  Speech  Volume:Decreased  Handedness:Right   Mood and Affect  Mood:Dysphoric; Depressed  Affect:Congruent   Thought Process  Thought Processes:Coherent  Descriptions of Associations:Intact  Orientation:Full (Time, Place and Person)  Thought Content:Logical  History of Schizophrenia/Schizoaffective disorder:No data recorded Duration of Psychotic Symptoms:No data recorded Hallucinations:Hallucinations: None  Ideas of Reference:None  Suicidal Thoughts:Suicidal Thoughts: No  Homicidal Thoughts:Homicidal Thoughts: No   Sensorium  Memory:Immediate Good; Recent Good  Judgment:Fair  Insight:Good   Executive Functions  Concentration:Fair  Attention Span:Fair  Recall:Good  Fund of Knowledge:Fair  Language:Good   Psychomotor Activity  Psychomotor Activity:Psychomotor Activity: Decreased   Assets  Assets:Communication Skills; Desire for Improvement; Housing; Social Support   Sleep  Sleep:Sleep: Poor    Physical Exam: Physical Exam Vitals and nursing note reviewed.  Constitutional:      Appearance: Normal appearance.  Pulmonary:     Effort: Pulmonary effort is normal.  Musculoskeletal:        General: Normal range of motion.     Cervical back: Normal range of motion.  Neurological:     General: No focal deficit present.     Mental Status: He is alert and oriented to person, place, and time.  Psychiatric:        Attention and Perception: Attention normal. He does not perceive auditory or visual hallucinations.        Mood and Affect: Mood is anxious and depressed.        Speech: Speech normal.        Behavior: Behavior normal. Behavior is cooperative.        Thought Content: Thought content normal. Thought content is not paranoid or delusional. Thought content does not include homicidal or suicidal ideation. Thought content does not include homicidal or suicidal plan.        Cognition and Memory: Cognition normal.   Review of Systems  Constitutional:  Negative.  Negative for fever.  HENT: Negative.  Negative for congestion, sinus pain and sore throat.   Respiratory: Negative.  Negative for cough and shortness of breath.   Cardiovascular: Negative.   Gastrointestinal: Negative.   Genitourinary: Negative.   Musculoskeletal: Negative.   Neurological: Negative.    Blood pressure 127/79, pulse 87, temperature 98.5 F (36.9 C), temperature source Oral, resp. rate 18, height 5\' 11"  (1.803 m), weight 74.4 kg, SpO2 98 %. Body mass index is 22.87 kg/m.   Treatment Plan Summary: Daily contact with patient to assess and evaluate symptoms and progress in treatment and Medication management  Continue COWS protocol for drug withdrawal Robaxin 500 mg PO q 8 hrs PRN muscle spasms Naprosyn 500 mg PO BID PRN aching, pain discomfort Zofran 4 mg q 4 hrs PO PRN for nausea/vomiting Bentyl 20 mg q 6 hrs PO PRN spasms and cramping  Continue CIWA protocol with Ativan taper for benzo withdrawal  Continue Tegretol 100 mg chewable PO every 8 hours for barbiturate withdrawal  Insomnia: Continue Trazodone 50 mg PO at bedtime PRN  Anxiety: Continue Vistaril 25 mg PO for anxiety/agitation or CIWA < or = 10  Caloric and protein supplementation: Continue Ensure 237 mL BID between meals  Nicotine dependence  -Continue Nicoderm 21 mg patch daily   Laveda AbbeLaurie Britton [facility], NP 10/20/2020, 5:38 PM

## 2020-10-20 NOTE — Plan of Care (Signed)
?  Problem: Education: ?Goal: Emotional status will improve ?Outcome: Not Progressing ?Goal: Mental status will improve ?Outcome: Not Progressing ?  ?Problem: Activity: ?Goal: Sleeping patterns will improve ?Outcome: Not Progressing ?  ?

## 2020-10-20 NOTE — [facility] Group Notes (Signed)
Postives pushing through the pain of detoxing and getting better for daughter

## 2020-10-20 NOTE — [facility] Group Notes (Signed)
[facility] Group Notes:  (Nursing/MHT/Case Management/Adjunct)  Date:  10/19/2020 Time:  8:00 PM  Type of Therapy:  Group Therapy  Participation Level:  Did Not Attend  Participation Quality:  NA  Affect:  NA  Cognitive:  NA  Insight:  None  Engagement in Group:  NA  Modes of Intervention:  NA  Summary of Progress/Problems:  Jacob Johns Jacob Johns 10/19/2020, 8:00 PM 

## 2020-10-20 NOTE — Progress Notes (Signed)
  Pt reports anxiety level of (4/10).  Pt complains of general body aches and pain (7/10) and that he is experiencing shakes.  Pt denies SI/HI, and AVH.  Pt states, "wants to sleep."  Medication administered  Pt remains safe on unit with Q 15 minute safety checks.    10/20/20 0020  Psych Admission Type (Psych Patients Only)  Admission Status Voluntary  Psychosocial Assessment  Patient Complaints Anxiety  Eye Contact Fair  Facial Expression Pensive;Pained  Affect Appropriate to circumstance  Speech Logical/coherent  Interaction Assertive (wdl)  Motor Activity Other (Comment)  Appearance/Hygiene Unremarkable  Behavior Characteristics Cooperative;Anxious  Mood Anxious  Thought Process  Coherency WDL  Content Blaming self  Delusions None reported or observed  Perception WDL  Hallucination None reported or observed  Judgment Impaired  Confusion None  Danger to Self  Current suicidal ideation? Denies  Danger to Others  Danger to Others None reported or observed

## 2020-10-20 NOTE — Progress Notes (Signed)
Pt is A&Ox4, denies SI/HI/AVH, c/o withdrawal symptoms, requests and is given PRN medications for those complaints with good effect, see MAR for more details. Pt is calm, cooperative and pleasant. Will continue to monitor pt per Q15 minute face checks and monitor for safety and progress.

## 2020-10-20 NOTE — Progress Notes (Signed)
Pt did not attend group. 

## 2020-10-21 DIAGNOSIS — F1994 Other psychoactive substance use, unspecified with psychoactive substance-induced mood disorder: Secondary | ICD-10-CM | POA: Diagnosis not present

## 2020-10-21 LAB — CBC WITH DIFFERENTIAL/PLATELET
Abs Immature Granulocytes: 0.09 10*3/uL — ABNORMAL HIGH (ref 0.00–0.07)
Basophils Absolute: 0.1 10*3/uL (ref 0.0–0.1)
Basophils Relative: 1 %
Eosinophils Absolute: 0.1 10*3/uL (ref 0.0–0.5)
Eosinophils Relative: 1 %
HCT: 40.2 % (ref 39.0–52.0)
Hemoglobin: 12.7 g/dL — ABNORMAL LOW (ref 13.0–17.0)
Immature Granulocytes: 1 %
Lymphocytes Relative: 32 %
Lymphs Abs: 2.4 10*3/uL (ref 0.7–4.0)
MCH: 29.4 pg (ref 26.0–34.0)
MCHC: 31.6 g/dL (ref 30.0–36.0)
MCV: 93.1 fL (ref 80.0–100.0)
Monocytes Absolute: 0.5 10*3/uL (ref 0.1–1.0)
Monocytes Relative: 6 %
Neutro Abs: 4.4 10*3/uL (ref 1.7–7.7)
Neutrophils Relative %: 59 %
Platelets: 385 10*3/uL (ref 150–400)
RBC: 4.32 MIL/uL (ref 4.22–5.81)
RDW: 15.6 % — ABNORMAL HIGH (ref 11.5–15.5)
WBC: 7.5 10*3/uL (ref 4.0–10.5)
nRBC: 0 % (ref 0.0–0.2)

## 2020-10-21 LAB — HEPATIC FUNCTION PANEL
ALT: 101 U/L — ABNORMAL HIGH (ref 0–44)
AST: 36 U/L (ref 15–41)
Albumin: 3.8 g/dL (ref 3.5–5.0)
Alkaline Phosphatase: 81 U/L (ref 38–126)
Bilirubin, Direct: 0.1 mg/dL (ref 0.0–0.2)
Indirect Bilirubin: 0.4 mg/dL (ref 0.3–0.9)
Total Bilirubin: 0.5 mg/dL (ref 0.3–1.2)
Total Protein: 7.6 g/dL (ref 6.5–8.1)

## 2020-10-21 LAB — CARBAMAZEPINE LEVEL, TOTAL: Carbamazepine Lvl: 3.6 ug/mL — ABNORMAL LOW (ref 4.0–12.0)

## 2020-10-21 LAB — RPR: RPR Ser Ql: NONREACTIVE

## 2020-10-21 LAB — HIV ANTIBODY (ROUTINE TESTING W REFLEX): HIV Screen 4th Generation wRfx: NONREACTIVE

## 2020-10-21 NOTE — Progress Notes (Signed)
   10/20/20 2120  Psych Admission Type (Psych Patients Only)  Admission Status Voluntary  Psychosocial Assessment  Patient Complaints Apathy  Eye Contact Fair  Facial Expression Pensive;Pained  Affect Appropriate to circumstance  Speech Logical/coherent  Interaction Assertive (wdl)  Motor Activity Other (Comment) (wnl)  Appearance/Hygiene Unremarkable  Thought Process  Coherency WDL  Content Blaming self  Delusions None reported or observed  Perception WDL  Hallucination None reported or observed  Judgment Limited  Confusion WDL  Danger to Self  Current suicidal ideation? Denies  Danger to Others  Danger to Others None reported or observed

## 2020-10-21 NOTE — Progress Notes (Signed)
Patient c/o anxiety at 1026, PRN vistaril 25 mg given and reported effective by 1115.  Compliant with medications. Denies SI/HI/A/VH and verbally contracted for safety. Verbalized need to get sober for his unborn child. No adverse Drug noted.   Support and encouragement provided as needed. Q 15 minutes safety checks ongoing without self harm gestures. Will continue to monitor.

## 2020-10-21 NOTE — Progress Notes (Signed)
[facility] Group Notes:  (Nursing/MHT/Case Management/Adjunct)  Date:  10/21/2020  Time:  2000 Type of Therapy:   wrap up group  Participation Level:  Active  Participation Quality:  Appropriate, Attentive, Sharing, and Supportive  Affect:  Appropriate  Cognitive:  Alert  Insight:  Improving  Engagement in Group:  Engaged  Modes of Intervention:  Clarification, Education, and Support  Summary of Progress/Problems: Positive thinking and self-care were discussed.   [name] 10/21/2020, 11:30 PM

## 2020-10-21 NOTE — Progress Notes (Signed)
   10/21/20 2108  Psych Admission Type (Psych Patients Only)  Admission Status Voluntary  Psychosocial Assessment  Patient Complaints Substance abuse  Eye Contact Fair  Facial Expression Animated  Affect Appropriate to circumstance  Speech Logical/coherent  Interaction Assertive  Motor Activity Other (Comment) (WDL)  Appearance/Hygiene Unremarkable  Behavior Characteristics Cooperative;Appropriate to situation  Mood Pleasant  Thought Process  Coherency WDL  Content Blaming self  Delusions None reported or observed  Perception WDL  Hallucination None reported or observed  Judgment Limited  Confusion WDL  Danger to Self  Current suicidal ideation? Denies  Danger to Others  Danger to Others None reported or observed

## 2020-10-21 NOTE — Progress Notes (Signed)
[facility] MD Progress Note  10/21/2020 12:42 PM Jacob Johns  MRN:  [id number] Subjective:  "I feel better today, I woke up with no opiate withdrawal at all. That has never happened to me."    Objective: Jacob Johns is a 30 year old male with a history of depression, substance use disorder (opiates, benzodiazepines, methamphetamine, marijuana, alcohol including IVDU), hep C positive and history of childhood trauma who presented voluntarily on 10/18/2020 to [facility] reporting worsening depression, increased drug use and suicidal ideation with a plan to overdose on drugs. At [facility] patient reported a prior diagnosis of schizophrenia and endorsed symptoms of auditory hallucinations. Patient reported that his symptoms had worsened since losing his job 2 months ago.  Notes from [facility] also document that patient stated he was prescribed trazodone and Zoloft but was not taking them because he did not like mixing them with his street drug use.  He was admitted to [facility] for crisis stabilization and treatment.  Evaluation on the unit:  Patient was seen and evaluated, chart reviewed and case discussed with the treatment team. Patient stated he feels a lot better today. He stated he woke up without any opiate withdrawal which has never happened to him before. He stated he slept well last night, record shows he slept 5.75 hours. He stated his appetite is picking up and he feels hungry. He is attending to his personal hygiene needs. He wants to return home to his sister's house and get a job to help support his baby being born in [place] on 8/25. 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 He stated his sister is supportive and he will go to [employer]. He stated he has been to rehab numerous times and is not interested in going again. He denies SI/HI/AVH, paranoia and delusions. He denies drug cravings. He is taking his medications as  prescribed. His CIWA score today was 0 and his COWS score at 0800 was 6. He is currently not on an antidepressant, will defer starting one until he has had a chance to clear some of the substances he has been abusing. Of note he denies depression today.  He is attending group therapy and is going outside.  Patient is able to contract for safety on the unit.    Labs resulted today are: CMP with ALT 101, patient is Hep C+, CBC with Diff with Hgb 12.7. Carbamazepine level 3.6, sub therapeutic. HIV and RPR are non reactive.   Principal Problem: Substance induced mood disorder (HCC) Diagnosis: Principal Problem:   Substance induced mood disorder (HCC) Active Problems:   Polysubstance (including opioids) dependence, daily use (HCC)   Moderate opioid use disorder (HCC)   Moderate alcohol use disorder (HCC)  Total Time spent with patient: 20 minutes  Past Psychiatric History: Patient denies any history of prior psychiatric diagnoses or treatments.  Information in chart notes indicates that he previously reported a history of schizophrenia and depression.  Patient denies any prior inpatient psychiatric hospitalizations but chart notes indicate he reported prior inpatient psychiatric treatment in [place].  He denies any history of suicide attempts.  He denies any history of nonsuicidal self-injurious behavior  Past Medical History: History reviewed. No pertinent past medical history. History reviewed. No pertinent surgical history. Family History: History reviewed. No pertinent family history. Family Psychiatric  History: Patient denies any family psychiatric history of mental health  issues, alcohol or substance use issues or completed suicides. Social History:  Social History   Substance and Sexual Activity  Alcohol Use No     Social History   Substance and Sexual Activity  Drug Use Yes   Types: Marijuana, Cocaine    Social History   Socioeconomic History   Marital status: Single     Spouse name: Not on file   Number of children: Not on file   Years of education: Not on file   Highest education level: Not on file  Occupational History   Not on file  Tobacco Use   Smoking status: Every Day    Packs/day: 1.00    Types: Cigarettes   Smokeless tobacco: Never  Substance and Sexual Activity   Alcohol use: No   Drug use: Yes    Types: Marijuana, Cocaine   Sexual activity: Not on file  Other Topics Concern   Not on file  Social History Narrative   Not on file   Social Determinants of Health   Financial Resource Strain: Not on file  Food Insecurity: Not on file  Transportation Needs: Not on file  Physical Activity: Not on file  Stress: Not on file  Social Connections: Not on file   Additional Social History:                         Sleep: Fair  Appetite:  Fair  Current Medications: Current Facility-Administered Medications  Medication Dose Route Frequency Provider Last Rate Last Admin   acetaminophen (TYLENOL) tablet 650 mg  650 mg Oral Q6H PRN Jaclyn Shaggy, PA-C   650 mg at 10/20/20 1603   alum & mag hydroxide-simeth (MAALOX/MYLANTA) 200-200-20 MG/5ML suspension 30 mL  30 mL Oral Q4H PRN Melbourne Abts W, PA-C       carbamazepine (TEGRETOL) chewable tablet 100 mg  100 mg Oral Q8H Claudie Revering, MD   100 mg at 10/21/20 0615   dicyclomine (BENTYL) tablet 20 mg  20 mg Oral Q6H PRN Melbourne Abts W, PA-C       feeding supplement (ENSURE ENLIVE / ENSURE PLUS) liquid 237 mL  237 mL Oral BID BM Mason Jim, Amy E, MD   237 mL at 10/21/20 1029   hydrOXYzine (ATARAX/VISTARIL) tablet 25 mg  25 mg Oral Q6H PRN Melbourne Abts W, PA-C   25 mg at 10/21/20 1026   loperamide (IMODIUM) capsule 2-4 mg  2-4 mg Oral PRN Melbourne Abts W, PA-C       LORazepam (ATIVAN) tablet 1 mg  1 mg Oral BID Melbourne Abts W, PA-C   1 mg at 10/21/20 8563   Followed by   Melene Muller ON 10/22/2020] LORazepam (ATIVAN) tablet 1 mg  1 mg Oral Daily Ladona Ridgel, Cody W, PA-C       magnesium hydroxide  (MILK OF MAGNESIA) suspension 30 mL  30 mL Oral Daily PRN Melbourne Abts W, PA-C       methocarbamol (ROBAXIN) tablet 500 mg  500 mg Oral Q8H PRN Melbourne Abts W, PA-C   500 mg at 10/20/20 2118   multivitamin with minerals tablet 1 tablet  1 tablet Oral Daily Jaclyn Shaggy, PA-C   1 tablet at 10/21/20 0818   naproxen (NAPROSYN) tablet 500 mg  500 mg Oral BID PRN Jaclyn Shaggy, PA-C   500 mg at 10/20/20 1228   nicotine (NICODERM CQ - dosed in mg/24 hours) patch 14 mg  14 mg Transdermal Daily Jaclyn Shaggy,  PA-C   14 mg at 10/21/20 0817   ondansetron (ZOFRAN-ODT) disintegrating tablet 4 mg  4 mg Oral Q6H PRN Jaclyn Shaggy, PA-C   4 mg at 10/20/20 8413   thiamine tablet 100 mg  100 mg Oral Daily Jaclyn Shaggy, PA-C   100 mg at 10/21/20 0818   traZODone (DESYREL) tablet 50 mg  50 mg Oral QHS PRN Jaclyn Shaggy, PA-C   50 mg at 10/20/20 2118    Lab Results:  Results for orders placed or performed during the hospital encounter of 10/19/20 (from the past 48 hour(s))  Carbamazepine level, total     Status: Abnormal   Collection Time: 10/21/20  6:18 AM  Result Value Ref Range   Carbamazepine Lvl 3.6 (L) 4.0 - 12.0 ug/mL    Comment: Performed at [facility], [address], [place], [place] [address]  Hepatic function panel     Status: Abnormal   Collection Time: 10/21/20  6:18 AM  Result Value Ref Range   Total Protein 7.6 6.5 - 8.1 g/dL   Albumin 3.8 3.5 - 5.0 g/dL   AST 36 15 - 41 U/L   ALT 101 (H) 0 - 44 U/L   Alkaline Phosphatase 81 38 - 126 U/L   Total Bilirubin 0.5 0.3 - 1.2 mg/dL   Bilirubin, Direct 0.1 0.0 - 0.2 mg/dL   Indirect Bilirubin 0.4 0.3 - 0.9 mg/dL    Comment: Performed at [facility], [address] [place], [place] [address]  CBC with Differential/Platelet     Status: Abnormal   Collection Time: 10/21/20  6:18 AM  Result Value Ref Range   WBC 7.5 4.0 - 10.5 K/uL   RBC 4.32 4.22 - 5.81 MIL/uL   Hemoglobin 12.7 (L) 13.0 - 17.0 g/dL   HCT 36.6  44.0 - 34.7 %   MCV 93.1 80.0 - 100.0 fL   MCH 29.4 26.0 - 34.0 pg   MCHC 31.6 30.0 - 36.0 g/dL   RDW 42.5 (H) 95.6 - 38.7 %   Platelets 385 150 - 400 K/uL   nRBC 0.0 0.0 - 0.2 %   Neutrophils Relative % 59 %   Neutro Abs 4.4 1.7 - 7.7 K/uL   Lymphocytes Relative 32 %   Lymphs Abs 2.4 0.7 - 4.0 K/uL   Monocytes Relative 6 %   Monocytes Absolute 0.5 0.1 - 1.0 K/uL   Eosinophils Relative 1 %   Eosinophils Absolute 0.1 0.0 - 0.5 K/uL   Basophils Relative 1 %   Basophils Absolute 0.1 0.0 - 0.1 K/uL   Immature Granulocytes 1 %   Abs Immature Granulocytes 0.09 (H) 0.00 - 0.07 K/uL    Comment: Performed at [facility], [address], [place], [place] [address]  HIV Antibody (routine testing w rflx)     Status: None   Collection Time: 10/21/20  6:18 AM  Result Value Ref Range   HIV Screen 4th Generation wRfx Non Reactive Non Reactive    Comment: Performed at [facility], [address] [place], [place] [address]  RPR     Status: None   Collection Time: 10/21/20  6:18 AM  Result Value Ref Range   RPR Ser Ql NON REACTIVE NON REACTIVE    Comment: Performed at [facility], [address] [place], [place] [address]    Blood Alcohol level:  Lab Results  Component Value Date   ETH 108 (H) 10/23/2018  ETH <11 04/07/2013    Metabolic Disorder Labs: Lab Results  Component Value Date   HGBA1C 5.7 (H) 10/18/2020   MPG 116.89 10/18/2020   No results found for: PROLACTIN Lab Results  Component Value Date   CHOL 66 10/18/2020   TRIG 49 10/18/2020   HDL 26 (L) 10/18/2020   CHOLHDL 2.5 10/18/2020   VLDL 10 10/18/2020   LDLCALC 30 10/18/2020    Physical Findings: AIMS:  , ,  ,  ,    CIWA:  CIWA-Ar Total: 1 COWS:  COWS Total Score: 6  Musculoskeletal: Strength & Muscle Tone: within normal limits Gait & Station: normal Patient leans: N/A  Psychiatric Specialty Exam:  Presentation  General Appearance: Appropriate for Environment  Eye  Contact:Good  Speech:Normal Rate  Speech Volume:Decreased  Handedness:Right   Mood and Affect  Mood:Dysphoric; Depressed  Affect:Congruent   Thought Process  Thought Processes:Coherent  Descriptions of Associations:Intact  Orientation:Full (Time, Place and Person)  Thought Content:Logical  History of Schizophrenia/Schizoaffective disorder:No data recorded Duration of Psychotic Symptoms:No data recorded Hallucinations:No data recorded  Ideas of Reference:None  Suicidal Thoughts:No data recorded  Homicidal Thoughts:No data recorded   Sensorium  Memory:Immediate Good; Recent Good  Judgment:Fair  Insight:Good   Executive Functions  Concentration:Fair  Attention Span:Fair  Recall:Good  Fund of Knowledge:Fair  Language:Good   Psychomotor Activity  Psychomotor Activity:No data recorded   Assets  Assets:Communication Skills; Desire for Improvement; Housing; Social Support   Sleep  Sleep:No data recorded    Physical Exam: Physical Exam Vitals and nursing note reviewed.  Constitutional:      Appearance: Normal appearance.  Pulmonary:     Effort: Pulmonary effort is normal.  Musculoskeletal:        General: Normal range of motion.     Cervical back: Normal range of motion.  Neurological:     General: No focal deficit present.     Mental Status: He is alert and oriented to person, place, and time.  Psychiatric:        Attention and Perception: Attention normal. He does not perceive auditory or visual hallucinations.        Mood and Affect: Mood is anxious and depressed.        Speech: Speech normal.        Behavior: Behavior normal. Behavior is cooperative.        Thought Content: Thought content normal. Thought content is not paranoid or delusional. Thought content does not include homicidal or suicidal ideation. Thought content does not include homicidal or suicidal plan.        Cognition and Memory: Cognition normal.   Review of Systems   Constitutional: Negative.  Negative for fever.  HENT: Negative.  Negative for congestion, sinus pain and sore throat.   Respiratory: Negative.  Negative for cough and shortness of breath.   Cardiovascular: Negative.   Gastrointestinal: Negative.   Genitourinary: Negative.   Musculoskeletal: Negative.   Neurological: Negative.    Blood pressure 125/73, pulse 73, temperature 97.9 F (36.6 C), temperature source Oral, resp. rate 18, height  (1.803 m), weight 74.4 kg, SpO2 98 %. Body mass index is 22.87 kg/m.   Treatment Plan Summary: Daily contact with patient to assess and evaluate symptoms and progress in treatment and Medication management  Continue COWS protocol for drug withdrawal Robaxin 500 mg PO q 8 hrs PRN muscle spasms Naprosyn 500 mg PO BID PRN aching, pain discomfort Zofran 4 mg q 4 hrs PO PRN for nausea/vomiting Bentyl 20 mg  q 6 hrs PO PRN spasms and cramping  Continue CIWA protocol with Ativan taper for benzo withdrawal  Continue Tegretol 100 mg chewable PO every 8 hours for barbiturate withdrawal  Insomnia: Continue Trazodone 50 mg PO at bedtime PRN  Anxiety: Continue Vistaril 25 mg PO for anxiety/agitation or CIWA < or = 10  Caloric and protein supplement: Continue Ensure 237 mL BID between meals  Nicotine dependence  -Continue Nicoderm 21 mg patch daily   Laveda AbbeLaurie Britton Zoeya Gramajo, NP 10/21/2020, 12:42 PM

## 2020-10-21 NOTE — [facility] Group Notes (Signed)
Adult Psychoeducational Group Note  Date:  10/21/2020 Time:  9:36 AM  Group Topic/Focus:  Goals Group:   The focus of this group is to help patients establish daily goals to achieve during treatment and discuss how the patient can incorporate goal setting into their daily lives to aide in recovery.  Participation Level:  Active  Participation Quality:  Appropriate  Affect:  Appropriate  Cognitive:  Alert and Appropriate  Insight: Appropriate  Engagement in Group:  Engaged  Modes of Intervention:  Orientation  Additional Comments:  Goal is to stay positive  Jacob Johns 10/21/2020, 9:36 AM

## 2020-10-21 NOTE — [facility] Group Notes (Signed)
Adult Psychoeducational Group Note  Date:  10/21/2020 Time:  11:31 AM  Group Topic/Focus:  Healthy Communication:   The focus of this group is to discuss communication, barriers to communication, as well as healthy ways to communicate with others.  Participation Level:  Active  Participation Quality:  Appropriate  Affect:  Appropriate  Cognitive:  Appropriate  Insight: Good  Engagement in Group:  Engaged and Supportive  Modes of Intervention:  Socialization and Support  Additional Comments:  Azalee Course 10/21/2020, 11:31 AM

## 2020-10-22 DIAGNOSIS — F1994 Other psychoactive substance use, unspecified with psychoactive substance-induced mood disorder: Secondary | ICD-10-CM | POA: Diagnosis not present

## 2020-10-22 MED ORDER — TRAZODONE HCL 100 MG PO TABS
100.0000 mg | ORAL_TABLET | Freq: Every day | ORAL | Status: DC
  Administered 2020-10-22: 100 mg via ORAL
  Filled 2020-10-22: qty 7
  Filled 2020-10-22 (×2): qty 1

## 2020-10-22 MED ORDER — CARBAMAZEPINE 100 MG PO CHEW
200.0000 mg | CHEWABLE_TABLET | Freq: Two times a day (BID) | ORAL | Status: DC
  Administered 2020-10-22 – 2020-10-23 (×2): 200 mg via ORAL
  Filled 2020-10-22: qty 2
  Filled 2020-10-22: qty 28
  Filled 2020-10-22 (×3): qty 2
  Filled 2020-10-22: qty 28

## 2020-10-22 NOTE — Progress Notes (Signed)
Recreation Therapy Notes  Date: 8.22.22 Time: 0930 Location: [address] Dayroom  Group Topic: Stress Management   Goal Area(s) Addresses:  Patient will actively participate in stress management techniques presented during session.  Patient will successfully identify benefit of practicing stress management post d/c.   Behavioral Response:  Appropriate  Intervention: Relaxation exercise with ambient sound and script   Activity: Guided Imagery. LRT provided education, instruction, and demonstration on practice of visualization via guided imagery. Patient was asked to participate in the technique introduced during session.  Patients were given suggestions of ways to access scripts post d/c and encouraged to explore Youtube and other apps available on smartphones, tablets, and computers.  Education:  Stress Management, Discharge Planning.   Education Outcome:  Acknowledges education  Clinical Observations/Feedback: Patient actively engaged in technique introduced, expressed no concerns and demonstrated ability to practice skill independently post d/c.      Caroll Rancher, LRT/CTRS         Lillia Abed, Loraina Stauffer A 10/22/2020 12:12 PM

## 2020-10-22 NOTE — Progress Notes (Addendum)
   10/22/20 1200  Psych Admission Type (Psych Patients Only)  Admission Status Voluntary  Psychosocial Assessment  Patient Complaints Substance abuse  Eye Contact Fair  Facial Expression Animated  Affect Appropriate to circumstance  Speech Logical/coherent  Interaction Assertive  Motor Activity Other (Comment) (WDL)  Appearance/Hygiene Unremarkable  Thought Process  Coherency WDL  Content Blaming self  Delusions None reported or observed  Perception WDL  Hallucination None reported or observed  Judgment Limited  Confusion WDL  Danger to Self  Current suicidal ideation? Denies  Danger to Others  Danger to Others None reported or observed   D. Pt has been visible in the milieu throughout the shift, observed interacting appropriately with peers and staff. Per pt's self inventory, pt rated his depression, hopelessness and anxiety all 0's. Pt reported that he is expecting his first child, and would like to be discharged before the baby comes.  Pt currently denies SI/HI  A. Labs and vitals monitored. Pt administered scheduled meds and given prn meds (Tylenol and Naproxen)  for tooth ache. . Pt supported emotionally and encouraged to express concerns and ask questions.   R. Pt remains safe with 15 minute checks. Will continue POC.

## 2020-10-22 NOTE — Progress Notes (Signed)
   10/22/20 2104  Psych Admission Type (Psych Patients Only)  Admission Status Voluntary  Psychosocial Assessment  Patient Complaints Substance abuse  Eye Contact Fair  Facial Expression Animated  Affect Appropriate to circumstance  Speech Logical/coherent  Interaction Assertive  Motor Activity Other (Comment) (WDL)  Appearance/Hygiene Unremarkable  Behavior Characteristics Appropriate to situation;Cooperative  Mood Pleasant  Thought Process  Coherency WDL  Content Blaming self  Delusions None reported or observed  Perception WDL  Hallucination None reported or observed  Judgment Impaired  Confusion None  Danger to Self  Current suicidal ideation? Denies  Danger to Others  Danger to Others None reported or observed

## 2020-10-22 NOTE — Progress Notes (Signed)
[facility] MD Progress Note  10/22/2020 3:23 PM Jacob Johns  MRN:  [id number]  Reason for admission:  Jacob Johns is a 30 year old male with a history of depression, substance use disorder (opiates, benzodiazepines, methamphetamine, marijuana, alcohol including IVDU), hep C positive and history of childhood trauma who presented voluntarily on 10/18/2020 to [facility] reporting worsening depression, increased drug use and suicidal ideation with a plan to overdose on drugs. Urine tox screen was positive for secobarbital/barbiturates, buprenorphine, cocaine, oxycodone and marijuana but was not positive for benzodiazepines.   Objective: Medical record reviewed.  Patient's case discussed in detail with nursing staff and members of the treatment team.  I met with and evaluated the patient on the unit today for follow-up.  The patient appears improved today.  He is pleasant and appropriately engaged in our conversation.  Eye contact is good.  There are no motor abnormalities.  Affect is bright.  He reports improved mood.  I observed no diaphoresis or tremor on exam today.  Patient denies any withdrawal symptoms.  He states that he feels much better than he did on Friday.  He reports good appetite and good sleep.  Patient denies depressed mood, anhedonia, significant anxiety, SI, AI, HI, PI, AH or VH or confusion.  He states desire for discharge this week so that he can travel to be with family for the birth of his child.  Patient states desire to stop using substances.  He denies any medication side effects.  He reports some pain of the upper right jaw which he states is secondary to chronic tooth pain.  Patient denies other physical problems.  This morning patient signed his knee to our request for discharge.  Staff document the patient slept 5.75 hours last night.  Vital signs are stable.  Labs drawn yesterday morning include hepatic function panel with ALT of 101 (down from 173 on admission) and otherwise WNL.  CBC  and differential with hemoglobin of 12.7, RDW of 15.6 and otherwise WNL.  Carbamazepine level was 3.6 (on 100 mg Q8H dosing initiated 10/19/2020; too soon for therapeutic level to be achieved).  No new labs today.  Patient has taken scheduled medications as prescribed.  He took PRN trazodone 9m x1 and PRN hydroxyzine 25 mg x 1 last night for sleep and anxiety at 9 PM.  Staff document the patient appears improved and he has been attending and participating in groups.  CIWA scores: 1 at 11:30 AM yesterday; 3 at 6 PM yesterday; 0 at 9 PM last night; 2 at noon today.  Most recent COWS score: 1.  Principal Problem: Substance induced mood disorder (HCC) Diagnosis: Principal Problem:   Substance induced mood disorder (HCC) Active Problems:   Polysubstance (including opioids) dependence, daily use (HCC)   Moderate opioid use disorder (HCC)   Moderate alcohol use disorder (HCC)  Total Time spent with patient:  25 minutes  Past Psychiatric History: See admission H&P  Past Medical History: History reviewed. No pertinent past medical history. History reviewed. No pertinent surgical history. Family History: History reviewed. No pertinent family history. Family Psychiatric  History: See admission H&P Social History:  Social History   Substance and Sexual Activity  Alcohol Use No     Social History   Substance and Sexual Activity  Drug Use Yes   Types: Marijuana, Cocaine    Social History   Socioeconomic History   Marital status: Single    Spouse name: Not on file   Number of children: Not on file  Years of education: Not on file   Highest education level: Not on file  Occupational History   Not on file  Tobacco Use   Smoking status: Every Day    Packs/day: 1.00    Types: Cigarettes   Smokeless tobacco: Never  Substance and Sexual Activity   Alcohol use: No   Drug use: Yes    Types: Marijuana, Cocaine   Sexual activity: Not on file  Other Topics Concern   Not on file  Social  History Narrative   Not on file   Social Determinants of Health   Financial Resource Strain: Not on file  Food Insecurity: Not on file  Transportation Needs: Not on file  Physical Activity: Not on file  Stress: Not on file  Social Connections: Not on file   Additional Social History:                         Sleep: Good  Appetite:  Good  Current Medications: Current Facility-Administered Medications  Medication Dose Route Frequency Provider Last Rate Last Admin   acetaminophen (TYLENOL) tablet 650 mg  650 mg Oral Q6H PRN Prescilla Sours, PA-C   650 mg at 10/22/20 0800   alum & mag hydroxide-simeth (MAALOX/MYLANTA) 200-200-20 MG/5ML suspension 30 mL  30 mL Oral Q4H PRN Margorie John W, PA-C       carbamazepine (TEGRETOL) chewable tablet 200 mg  200 mg Oral Q12H Arthor Captain, MD       dicyclomine (BENTYL) tablet 20 mg  20 mg Oral Q6H PRN Margorie John W, PA-C       feeding supplement (ENSURE ENLIVE / ENSURE PLUS) liquid 237 mL  237 mL Oral BID BM Nelda Marseille, Amy E, MD   237 mL at 10/22/20 1401   hydrOXYzine (ATARAX/VISTARIL) tablet 25 mg  25 mg Oral Q6H PRN Prescilla Sours, PA-C   25 mg at 10/21/20 2108   loperamide (IMODIUM) capsule 2-4 mg  2-4 mg Oral PRN Margorie John W, PA-C       magnesium hydroxide (MILK OF MAGNESIA) suspension 30 mL  30 mL Oral Daily PRN Margorie John W, PA-C       methocarbamol (ROBAXIN) tablet 500 mg  500 mg Oral Q8H PRN Margorie John W, PA-C   500 mg at 10/21/20 2108   multivitamin with minerals tablet 1 tablet  1 tablet Oral Daily Margorie John W, PA-C   1 tablet at 10/22/20 0800   naproxen (NAPROSYN) tablet 500 mg  500 mg Oral BID PRN Prescilla Sours, PA-C   500 mg at 10/22/20 1400   nicotine (NICODERM CQ - dosed in mg/24 hours) patch 14 mg  14 mg Transdermal Daily Margorie John W, PA-C   14 mg at 10/22/20 0659   ondansetron (ZOFRAN-ODT) disintegrating tablet 4 mg  4 mg Oral Q6H PRN Prescilla Sours, PA-C   4 mg at 10/20/20 9233   thiamine tablet 100 mg   100 mg Oral Daily Margorie John W, PA-C   100 mg at 10/22/20 0800   traZODone (DESYREL) tablet 100 mg  100 mg Oral QHS Arthor Captain, MD       traZODone (DESYREL) tablet 50 mg  50 mg Oral QHS PRN Prescilla Sours, PA-C   50 mg at 10/21/20 2108    Lab Results:  Results for orders placed or performed during the hospital encounter of 10/19/20 (from the past 48 hour(s))  Carbamazepine level, total  Status: Abnormal   Collection Time: 10/21/20  6:18 AM  Result Value Ref Range   Carbamazepine Lvl 3.6 (L) 4.0 - 12.0 ug/mL    Comment: Performed at [facility] [address] [place], [place] [address]  Hepatic function panel     Status: Abnormal   Collection Time: 10/21/20  6:18 AM  Result Value Ref Range   Total Protein 7.6 6.5 - 8.1 g/dL   Albumin 3.8 3.5 - 5.0 g/dL   AST 36 15 - 41 U/L   ALT 101 (H) 0 - 44 U/L   Alkaline Phosphatase 81 38 - 126 U/L   Total Bilirubin 0.5 0.3 - 1.2 mg/dL   Bilirubin, Direct 0.1 0.0 - 0.2 mg/dL   Indirect Bilirubin 0.4 0.3 - 0.9 mg/dL    Comment: Performed at [facility], [place] [address] [place], [place] [address]  CBC with Differential/Platelet     Status: Abnormal   Collection Time: 10/21/20  6:18 AM  Result Value Ref Range   WBC 7.5 4.0 - 10.5 K/uL   RBC 4.32 4.22 - 5.81 MIL/uL   Hemoglobin 12.7 (L) 13.0 - 17.0 g/dL   HCT 40.2 39.0 - 52.0 %   MCV 93.1 80.0 - 100.0 fL   MCH 29.4 26.0 - 34.0 pg   MCHC 31.6 30.0 - 36.0 g/dL   RDW 15.6 (H) 11.5 - 15.5 %   Platelets 385 150 - 400 K/uL   nRBC 0.0 0.0 - 0.2 %   Neutrophils Relative % 59 %   Neutro Abs 4.4 1.7 - 7.7 K/uL   Lymphocytes Relative 32 %   Lymphs Abs 2.4 0.7 - 4.0 K/uL   Monocytes Relative 6 %   Monocytes Absolute 0.5 0.1 - 1.0 K/uL   Eosinophils Relative 1 %   Eosinophils Absolute 0.1 0.0 - 0.5 K/uL   Basophils Relative 1 %   Basophils Absolute 0.1 0.0 - 0.1 K/uL   Immature Granulocytes 1 %   Abs Immature Granulocytes 0.09 (H) 0.00 - 0.07 K/uL    Comment:  Performed at [facility], [place] [address] [place], [place] [address]  HIV Antibody (routine testing w rflx)     Status: None   Collection Time: 10/21/20  6:18 AM  Result Value Ref Range   HIV Screen 4th Generation wRfx Non Reactive Non Reactive    Comment: Performed at [facility], [place] [address], [place], [place] [address]  RPR     Status: None   Collection Time: 10/21/20  6:18 AM  Result Value Ref Range   RPR Ser Ql NON REACTIVE NON REACTIVE    Comment: Performed at [facility] [address] [place], [place] [address]    Blood Alcohol level:  Lab Results  Component Value Date   ETH 108 (H) 10/23/2018   ETH <11 03/55/9741    Metabolic Disorder Labs: Lab Results  Component Value Date   HGBA1C 5.7 (H) 10/18/2020   MPG 116.89 10/18/2020   No results found for: PROLACTIN Lab Results  Component Value Date   CHOL 66 10/18/2020   TRIG 49 10/18/2020   HDL 26 (L) 10/18/2020   CHOLHDL 2.5 10/18/2020   VLDL 10 10/18/2020   LDLCALC 30 10/18/2020    Physical Findings: AIMS:  , ,  ,  ,    CIWA:  CIWA-Ar Total: 2 COWS:  COWS Total Score: 1  Musculoskeletal: Strength & Muscle Tone: within normal limits Gait & Station: normal  Patient leans: N/A  Psychiatric Specialty Exam:  Presentation  General Appearance: Appropriate for Environment; Fairly Groomed  Eye Contact:Good  Speech:Clear and Coherent; Normal Rate  Speech Volume:Normal  Handedness:Right   Mood and Affect  Mood:Anxious  Affect:Appropriate; Full Range   Thought Process  Thought Processes:Coherent; Goal Directed  Descriptions of Associations:Intact  Orientation:Full (Time, Place and Person)  Thought Content:Logical; WDL  History of Schizophrenia/Schizoaffective disorder:No data recorded Duration of Psychotic Symptoms:No data recorded Hallucinations:Hallucinations: None  Ideas of Reference:None  Suicidal Thoughts:Suicidal Thoughts: No  Homicidal  Thoughts:Homicidal Thoughts: No   Sensorium  Memory:Immediate Good; Recent Good  Judgment:Fair  Insight:Good   Executive Functions  Concentration:Good  Attention Span:Good  [place] of Knowledge:Good  Language:Good   Psychomotor Activity  Psychomotor Activity:Psychomotor Activity: Normal   Assets  Assets:Communication Skills; Desire for Improvement; Housing; Social Support   Sleep  Sleep:Sleep: Good Number of Hours of Sleep: 5.75    Physical Exam: Physical Exam Vitals and nursing note reviewed.  Constitutional:      General: He is not in acute distress.    Appearance: Normal appearance. He is not diaphoretic.  HENT:     Head: Normocephalic and atraumatic.  Cardiovascular:     Rate and Rhythm: Normal rate.  Pulmonary:     Effort: Pulmonary effort is normal.  Neurological:     General: No focal deficit present.     Mental Status: He is alert and oriented to person, place, and time.   Review of Systems  Constitutional:  Negative for chills, diaphoresis, fever and malaise/fatigue.  HENT:  Negative for sore throat.   Respiratory:  Negative for cough and shortness of breath.   Cardiovascular:  Negative for chest pain and palpitations.  Gastrointestinal:  Negative for constipation, diarrhea, nausea and vomiting.  Musculoskeletal:  Negative for myalgias.  Skin:  Negative for rash.  Neurological:  Negative for dizziness, tremors, seizures and headaches.  Psychiatric/Behavioral:  Negative for depression, hallucinations and suicidal ideas. The patient is nervous/anxious. The patient does not have insomnia.   All other systems reviewed and are negative. Blood pressure (!) 123/56, pulse 90, temperature 98 F (36.7 C), temperature source Oral, resp. rate 18, height 5' 11"  (1.803 m), weight 74.4 kg, SpO2 99 %. Body mass index is 22.87 kg/m.   Treatment Plan Summary: Patient is 30 year old male with history of polysubstance use disorder, depression,  childhood trauma admitted due to worsening symptoms of depression and suicidal ideation in the context of job loss and increased substance use. Patient endorsed recent daily use of fentanyl/opioids, benzodiazepines/Klonopin, alcohol, methamphetamine, and marijuana prior to admission and drug screen was also positive for barbiturates/secobarbital.   Diagnosis on admission was substance-induced mood disorder and polysubstance use disorder.  Patient has denied suicidal ideation since my first visit with him on Friday.  He has improved since Friday and is now reporting euthymic mood and denying any withdrawal symptoms.      Daily contact with patient to assess and evaluate symptoms and progress in treatment and Medication management  Substance use disorder -Patient is currently denying any withdrawal symptoms and is not displaying any signs of withdrawal from benzodiazepines, opioids or barbiturates -Continue COWS protocol with comfort meds to cover for symptoms of opioid withdrawal -Continue CIWA protocol with comfort meds, standing dose Ativan taper and PRN Ativan to cover for possible alcohol or benzodiazepine withdrawal.  Patient completes Ativan taper today. Continue Tegretol and change dosing to 200 mg PO Q12 H for seizure prophylaxis for barbiturate withdrawal.  Patient  can be tapered off Tegretol as an outpatient.  Check Tegretol level, hepatic function panel, CBC with differential with a.m. draw tomorrow. -I have advised patient to participate in residential substance abuse treatment program after discharge.  I have provided education regarding the potential risks of substance use.  Patient stated understanding of information discussed.  Insomnia -Start trazodone 100 mg nightly -Continue trazodone 50 mg nightly PRN  Anxiety -Continue hydroxyzine 25 mg Q6H PRN  Discharge planning in progress.  Arthor Captain, MD 10/22/2020, 3:23 PM

## 2020-10-22 NOTE — Progress Notes (Signed)
Adult Psychoeducational Group Note  Date:  10/22/2020 Time:  6:27 PM  Group Topic/Focus:  Goals Group:   The focus of this group is to help patients establish daily goals to achieve during treatment and discuss how the patient can incorporate goal setting into their daily lives to aide in recovery.  Participation Level:  Active  Participation Quality:  Appropriate  Affect:  Appropriate  Cognitive:  Alert and Appropriate  Insight: Appropriate  Engagement in Group:  Engaged  Modes of Intervention:  Discussion and Education  Additional Comments:  Pt is expecting his first child and like to be present for the delivery.   Jacob Johns E 10/22/2020, 6:27 PM

## 2020-10-22 NOTE — Progress Notes (Addendum)
Patient signed a 72 hr request for discharge today at 936

## 2020-10-22 NOTE — [facility] Group Notes (Signed)
Pt did not attend AA meeting this evening.  

## 2020-10-22 NOTE — [facility] Group Notes (Signed)
LCSW Group Therapy Note    Type of Therapy and Topic:  Group Therapy: Anger and Coping Skills  Participation Level:  Did not attend   Description of Group:   In this group, patients learned how to recognize the physical, cognitive, emotional, and behavioral responses they have to anger-provoking situations.  They identified how they usually or often react when angered, and learned how healthy and unhealthy coping skills work initially, but the unhealthy ones stop working.   They analyzed how their frequently-chosen coping skill is possibly beneficial and how it is possibly unhelpful.  The group discussed a variety of healthier coping skills that could help in resolving the actual issues, as well as how to go about planning for the the possibility of future similar situations.  Therapeutic Goals: Patients will identify one thing that makes them angry and how they feel emotionally and physically, what their thoughts are or tend to be in those situations, and what healthy or unhealthy coping mechanism they typically use Patients will identify how their coping technique works for them, as well as how it works against them. Patients will explore possible new behaviors to use in future anger situations. Patients will learn that anger itself is normal and cannot be eliminated, and that healthier coping skills can assist with resolving conflict rather than worsening situations.  Summary of Patient Progress:  Did not attend  Therapeutic Modalities:   Cognitive Behavioral Therapy Motivation Interviewing  Maurica Omura, LCSW, LCAS Clincal Social Worker  [facility]

## 2020-10-23 DIAGNOSIS — F1994 Other psychoactive substance use, unspecified with psychoactive substance-induced mood disorder: Secondary | ICD-10-CM | POA: Diagnosis not present

## 2020-10-23 LAB — CBC WITH DIFFERENTIAL/PLATELET
Abs Immature Granulocytes: 0.35 10*3/uL — ABNORMAL HIGH (ref 0.00–0.07)
Basophils Absolute: 0.1 10*3/uL (ref 0.0–0.1)
Basophils Relative: 1 %
Eosinophils Absolute: 0.2 10*3/uL (ref 0.0–0.5)
Eosinophils Relative: 2 %
HCT: 38.1 % — ABNORMAL LOW (ref 39.0–52.0)
Hemoglobin: 12.3 g/dL — ABNORMAL LOW (ref 13.0–17.0)
Immature Granulocytes: 4 %
Lymphocytes Relative: 31 %
Lymphs Abs: 2.5 10*3/uL (ref 0.7–4.0)
MCH: 30.2 pg (ref 26.0–34.0)
MCHC: 32.3 g/dL (ref 30.0–36.0)
MCV: 93.6 fL (ref 80.0–100.0)
Monocytes Absolute: 0.7 10*3/uL (ref 0.1–1.0)
Monocytes Relative: 8 %
Neutro Abs: 4.3 10*3/uL (ref 1.7–7.7)
Neutrophils Relative %: 54 %
Platelets: 428 10*3/uL — ABNORMAL HIGH (ref 150–400)
RBC: 4.07 MIL/uL — ABNORMAL LOW (ref 4.22–5.81)
RDW: 15.7 % — ABNORMAL HIGH (ref 11.5–15.5)
WBC: 8.1 10*3/uL (ref 4.0–10.5)
nRBC: 0 % (ref 0.0–0.2)

## 2020-10-23 LAB — HEPATIC FUNCTION PANEL
ALT: 73 U/L — ABNORMAL HIGH (ref 0–44)
AST: 34 U/L (ref 15–41)
Albumin: 3.6 g/dL (ref 3.5–5.0)
Alkaline Phosphatase: 66 U/L (ref 38–126)
Bilirubin, Direct: <0.1 mg/dL (ref 0.0–0.2)
Total Bilirubin: 0.4 mg/dL (ref 0.3–1.2)
Total Protein: 7 g/dL (ref 6.5–8.1)

## 2020-10-23 LAB — CARBAMAZEPINE LEVEL, TOTAL: Carbamazepine Lvl: 4.5 ug/mL (ref 4.0–12.0)

## 2020-10-23 MED ORDER — NICOTINE 14 MG/24HR TD PT24: 14.0000 mg | MEDICATED_PATCH | Freq: Every day | TRANSDERMAL | 0 refills | Status: AC

## 2020-10-23 MED ORDER — CARBAMAZEPINE 100 MG PO CHEW: 200.0000 mg | CHEWABLE_TABLET | Freq: Two times a day (BID) | ORAL | 0 refills | Status: AC

## 2020-10-23 NOTE — Discharge Summary (Signed)
Physician Discharge Summary Note  Patient:  Jacob Johns is an 30 y.o., male MRN:  [id number] DOB:  06/17/1990 Patient phone:  [phone] (home)  Patient address:   [place] [address],  Total Time spent with patient: 30 minutes  Date of Admission:  10/19/2020 Date of Discharge: 10/23/2020  Reason for Admission:  (From MD's admission note): Jacob Johns is a 30 year old male with a history of depression, substance use disorder (opiates, benzodiazepines, methamphetamine, marijuana, alcohol including IVDU), hep C positive and history of childhood trauma who presented voluntarily on 10/18/2020 to [facility] reporting worsening depression, increased drug use and suicidal ideation with a plan to overdose on drugs.  At [facility] patient reported a prior diagnosis of schizophrenia and endorsed symptoms of auditory hallucinations.  Patient reported that his symptoms had worsened since losing his job 2 months ago.  Notes from [facility] also document that patient stated he was prescribed trazodone and Zoloft but was not taking them because he did not like mixing them with his street drug use.  He was admitted to [facility] for crisis stabilization and treatment.  During evaluation with me this morning, patient appears tired and poorly engaged in our conversation.  He states that he came to the hospital because he was tired of using drugs and wants to stop using.  He endorsed daily use of fentanyl intravenously for the past 4 months and estimates use of 1 g/day.  Patient reports 2 accidental overdoses on fentanyl during the past month.  He endorsed daily use of Klonopin (which he purchases off the street) for the past month and estimates that he takes 2 to 3 pills/day of unknown strength.  He consumes alcohol 2-3 times per week up to 5 shots of alcohol per day.  Patient also reports use of methamphetamine by smoking 2-3 times per months.  He denies cocaine use.  He denies barbiturate use.  Patient denies any  history of DTs, complicated withdrawal from substances and denies any history of seizure.  He reports current withdrawal symptoms of fatigue, nausea, muscle aches and chills.  During interview with me today the patient denies SI, AI, PI, AH or VH.  On exam today he does not appear to attend or respond to internal stimuli.  Patient does report depressed "miserable" mood, trouble sleeping, low energy, problems concentrating and difficulty getting out of bed which have worsened in recent weeks/months. Appetite is okay.  There is no diaphoresis, rhinorrhea, lacrimation or piloerection observed on exam.  There is no tremor or other motor abnormality observable on exam today and vital signs are stable and within normal limits.  Patient showed me injection sites on antecubital fossa bilaterally.  There is no sign of abscess or infection at injection sites.  When I asked whether he is interested in residential substance abuse treatment, the patient states that it is just day 1 and it is too soon for him to decide.  Patient states that he has been sharing needles and he is receptive to STI testing including HIV testing.  Urine tox screen was positive for secobarbital, buprenorphine, cocaine, oxycodone and marijuana but not benzodiazepines.  I asked patient if he was aware that he was taking barbiturates/secobarbital and he states that he was not.  Patient has been taking pills that he purchases off the street and he is uncertain of what is in them.  Review of PDMP shows no prescriptions reported to PDMP.  Patient reports that he is hep C positive.  He  denies any history of other medical conditions including asthma, seizure, diabetes, hypertension.  He takes no medications.  He reports no known allergies.     Principal Problem: Substance induced mood disorder ([place]) Discharge Diagnoses: Principal Problem:   Substance induced mood disorder (HCC) Active Problems:   Polysubstance (including opioids) dependence, daily  use (HCC)   Moderate opioid use disorder (HCC)   Moderate alcohol use disorder (HCC)   Past Psychiatric History: On interview today patient denies any history of prior psychiatric diagnoses or treatments.  Information in chart notes indicates that he previously reported a history of schizophrenia and depression.  Patient denies any prior inpatient psychiatric hospitalizations during interview with me today but chart notes indicate he reported prior inpatient psychiatric treatment in [place].  He denies any history of suicide attempts.  He denies any history of nonsuicidal self-injurious behavior.  Past Medical History: History reviewed. No pertinent past medical history. History reviewed. No pertinent surgical history. Family History: History reviewed. No pertinent family history. Family Psychiatric  History: Patient denies any family psychiatric history of mental health issues, alcohol or substance use issues or completed suicides. Social History:  Social History   Substance and Sexual Activity  Alcohol Use No     Social History   Substance and Sexual Activity  Drug Use Yes   Types: Marijuana, Cocaine    Social History   Socioeconomic History   Marital status: Single    Spouse name: Not on file   Number of children: Not on file   Years of education: Not on file   Highest education level: Not on file  Occupational History   Not on file  Tobacco Use   Smoking status: Every Day    Packs/day: 1.00    Types: Cigarettes   Smokeless tobacco: Never  Substance and Sexual Activity   Alcohol use: No   Drug use: Yes    Types: Marijuana, Cocaine   Sexual activity: Not on file  Other Topics Concern   Not on file  Social History Narrative   Not on file   Social Determinants of Health   Financial Resource Strain: Not on file  Food Insecurity: Not on file  Transportation Needs: Not on file  Physical Activity: Not on file  Stress: Not on file  Social Connections: Not on  file    Hospital Course:  After the above admission evaluation, Jacob Johns's presenting symptoms were noted. He was recommended for mood stabilization treatments. The medication regimen targeting those presenting symptoms were discussed with him & initiated with his consent. His UDS on arrival to the ED was positive for barbiturates, buprenorphine, cocaine, oxycodone and THC. His BAL was 108, he did not develop any alcohol and opiate withdrawal symptoms & did receive the appropriate detoxification medications and treatment.  He was not started on an antidepressant, he was admitted for detoxification and opted not to take any other medications. He was started on Tegretol to help with the barbiturates. He was discharged on this medication and has been instructed to follow up with his outpatient provider for discontinuance. He was however medicated, stabilized & discharged on the medications as listed on his discharge medication list below. Besides the mood stabilization treatments, Jacob Johns was also enrolled & participated in the group counseling sessions being offered & held on this unit. He learned coping skills. He presented no other significant pre-existing medical issues that required treatment. He tolerated his treatment regimen without any adverse effects or reactions reported.   During the course  of his hospitalization, the 15-minute checks were adequate to ensure patient's safety. Jacob Johns did not display any dangerous, violent or suicidal behavior on the unit.  He interacted with patients & staff appropriately, participated appropriately in the group sessions/therapies. His medications were addressed & adjusted to meet his needs. He was recommended for outpatient follow-up care & medication management upon discharge to assure continuity of care & mood stability.  At the time of discharge patient is not reporting any acute suicidal/homicidal ideations. He/She feels more confident about his self-care & in  managing his mental health. He currently denies any new issues or concerns. Education and supportive counseling provided throughout his hospital stay & upon discharge.   Today upon his discharge evaluation with the attending psychiatrist, Jacob Johns shares he is doing well. He denies any other specific concerns. He is sleeping well. His appetite is good. He denies other physical complaints. He denies AH/VH, delusional thoughts or paranoia. He does not appear to be responding to any internal stimuli. He feels that his medications have been helpful & is in agreement to continue his current treatment regimen as recommended. He was able to engage in safety planning including plan to return to [facility] or contact emergency services if he feels unable to maintain his own safety or the safety of others. Pt had no further questions, comments, or concerns. He left [facility] with all personal belongings in no apparent distress. Transportation per private vehicle with a friend.     Physical Findings: AIMS:  , ,  ,  ,    CIWA:  CIWA-Ar Total: 1 COWS:  COWS Total Score: 0  Musculoskeletal: Strength & Muscle Tone: within normal limits Gait & Station: normal Patient leans: N/A  Psychiatric Specialty Exam:  Presentation  General Appearance: Appropriate for Environment; Fairly Groomed  Eye Contact:Good  Speech:Clear and Coherent; Normal Rate  Speech Volume:Normal  Handedness:Right  Mood and Affect  Mood:Anxious  Affect:Appropriate; Full Range  Thought Process  Thought Processes:Coherent; Goal Directed  Descriptions of Associations:Intact  Orientation:Full (Time, Place and Person)  Thought Content:Logical; WDL  History of Schizophrenia/Schizoaffective disorder:No data recorded Duration of Psychotic Symptoms:No data recorded Hallucinations:Hallucinations: None  Ideas of Reference:None  Suicidal Thoughts:Suicidal Thoughts: No  Homicidal Thoughts:Homicidal Thoughts: No  Sensorium  Memory:Immediate  Good; Recent Good  Judgment:Fair  Insight:Good  Executive Functions  Concentration:Good  Attention Span:Good  [place] of Knowledge:Good  Language:Good  Psychomotor Activity  Psychomotor Activity:Psychomotor Activity: Normal  Assets  Assets:Communication Skills; Desire for Improvement; Housing; Social Support  Sleep  Sleep:Sleep: Good Number of Hours of Sleep: 5.75  Physical Exam: Physical Exam Vitals and nursing note reviewed.  Constitutional:      Appearance: Normal appearance.  HENT:     Head: Normocephalic and atraumatic.  Pulmonary:     Effort: Pulmonary effort is normal.  Musculoskeletal:        General: Normal range of motion.     Cervical back: Normal range of motion.  Neurological:     General: No focal deficit present.     Mental Status: He is alert and oriented to person, place, and time.  Psychiatric:        Attention and Perception: Attention normal. He does not perceive auditory or visual hallucinations.        Mood and Affect: Mood normal.        Speech: Speech normal.        Behavior: Behavior normal. Behavior is cooperative.        Thought Content: Thought content  normal. Thought content is not paranoid or delusional. Thought content does not include homicidal or suicidal ideation. Thought content does not include homicidal or suicidal plan.        Cognition and Memory: Cognition normal.   Review of Systems  Constitutional:  Negative for fever.  HENT:  Negative for congestion, sinus pain and sore throat.   Respiratory:  Negative for cough and shortness of breath.   Cardiovascular:  Negative for chest pain.  Gastrointestinal: Negative.   Genitourinary: Negative.   Musculoskeletal: Negative.   Neurological: Negative.    Blood pressure 131/77, pulse 91, temperature 98 F (36.7 C), temperature source Oral, resp. rate 16, height _0  (1.803 m), weight 74.4 kg, SpO2 100 %. Body mass index is 22.87 kg/m.   Social History    Tobacco Use  Smoking Status Every Day   Packs/day: 1.00   Types: Cigarettes  Smokeless Tobacco Never   Tobacco Cessation:  A prescription for an FDA-approved tobacco cessation medication provided at discharge   Blood Alcohol level:  Lab Results  Component Value Date   ETH 108 (H) 10/23/2018   ETH <11 93/79/0240    Metabolic Disorder Labs:  Lab Results  Component Value Date   HGBA1C 5.7 (H) 10/18/2020   MPG 116.89 10/18/2020   No results found for: PROLACTIN Lab Results  Component Value Date   CHOL 66 10/18/2020   TRIG 49 10/18/2020   HDL 26 (L) 10/18/2020   CHOLHDL 2.5 10/18/2020   VLDL 10 10/18/2020   LDLCALC 30 10/18/2020    See Psychiatric Specialty Exam and Suicide Risk Assessment completed by Attending Physician prior to discharge.  Discharge destination:  Home  Is patient on multiple antipsychotic therapies at discharge:  No   Has Patient had three or more failed trials of antipsychotic monotherapy by history:  No  Recommended Plan for Multiple Antipsychotic Therapies: NA  Discharge Instructions     Diet - low sodium heart healthy   Complete by: As directed    Increase activity slowly   Complete by: As directed       Allergies as of 10/23/2020   No Known Allergies      Medication List     TAKE these medications      Indication  carbamazepine 100 MG chewable tablet Commonly known as: TEGRETOL Chew 2 tablets (200 mg total) by mouth every 12 (twelve) hours.  Indication: Barbituate withdrawal   naloxone 4 MG/0.1ML Liqd nasal spray kit Commonly known as: NARCAN In case of an overdose, squirt into your nostril  Indication: Opioid Overdose   nicotine 14 mg/24hr patch Commonly known as: NICODERM CQ - dosed in mg/24 hours Place 1 patch (14 mg total) onto the skin daily. Start taking on: October 24, 2020  Indication: Nicotine Addiction        Follow-up Information     Services, [facility]. Go on 10/26/2020.   Why: You have a  hospital follow up for substance abuse intensive outpatient therapy and medication management services on 10/26/20 at 10:00 am.  This appointment will be held in person.  Please bring photo ID, proof of any income, and social security card (if available). Contact information: [place] [address] [phone]         [facility] [place] Follow up.   Why: This provider will call you to assist with finding  primary care, dental services, and medication assistance. Contact information: P: [phone]  Follow-up recommendations:  Activity:  as tolerated Diet:  Heart Healthy  Comments:  Prescriptions were given at discharge.  Patient is agreeable with the discharge plan.  He was given opportunity to ask questions.  He appears to feel comfortable with discharge and denies any current suicidal or homicidal thoughts.   Patient is instructed prior to discharge to: Take all medications as prescribed by his mental healthcare provider. Report any adverse effects and or reactions from the medicines to his outpatient provider promptly. Patient has been instructed & cautioned: To not engage in alcohol and or illegal drug use while on prescription medicines. Patient should always carry Narcan with him. He can get this free at some pharmacies and/or any place that provides services for opiate abuse.  In the event of worsening symptoms, patient is instructed to call the crisis hotline, 911 and or go to the nearest ED for appropriate evaluation and treatment of symptoms. To follow-up with his primary care provider for your other medical issues, concerns and or health care needs.   Signed: Ethelene Hal, NP 10/23/2020, 1:02 PM

## 2020-10-23 NOTE — BHH Suicide Risk Assessment (Signed)
[facility] Discharge Suicide Risk Assessment   Principal Problem: Substance induced mood disorder (HCC) Discharge Diagnoses: Principal Problem:   Substance induced mood disorder (HCC) Active Problems:   Polysubstance (including opioids) dependence, daily use (HCC)   Moderate opioid use disorder (HCC)   Moderate alcohol use disorder (HCC)   Total Time spent with patient: 20 minutes  Musculoskeletal: Strength & Muscle Tone: within normal limits Gait & Station: normal Patient leans: N/A  Psychiatric Specialty Exam  Presentation  General Appearance: Appropriate for Environment  Eye Contact:Good  Speech:Clear and Coherent; Normal Rate  Speech Volume:Normal  Handedness:Right   Mood and Affect  Mood:Euthymic  Duration of Depression Symptoms: Greater than two weeks  Affect:Congruent   Thought Process  Thought Processes:Coherent; Goal Directed  Descriptions of Associations:Intact  Orientation:Full (Time, Place and Person)  Thought Content:Logical; WDL  History of Schizophrenia/Schizoaffective disorder:No data recorded Duration of Psychotic Symptoms:No data recorded Hallucinations:Hallucinations: None  Ideas of Reference:None  Suicidal Thoughts:Suicidal Thoughts: No  Homicidal Thoughts:Homicidal Thoughts: No   Sensorium  Memory:Immediate Good; Recent Good; Remote Good  Judgment:Good  Insight:Good   Executive Functions  Concentration:Good  Attention Span:Good  Recall:Good  Fund of Knowledge:Good  Language:Good   Psychomotor Activity  Psychomotor Activity:Psychomotor Activity: Normal   Assets  Assets:Communication Skills; Desire for Improvement; Housing; Social Support   Sleep  Sleep:Sleep: Good Number of Hours of Sleep: 5.75   Physical Exam: Physical Exam Vitals and nursing note reviewed.  Constitutional:      General: He is not in acute distress.    Appearance: Normal appearance. He is not diaphoretic.  HENT:     Head: Normocephalic  and atraumatic.  Cardiovascular:     Rate and Rhythm: Normal rate.  Pulmonary:     Effort: Pulmonary effort is normal.  Neurological:     General: No focal deficit present.     Mental Status: He is alert and oriented to person, place, and time.     Motor: No tremor.   Review of Systems  Constitutional: Negative.   HENT: Negative.    Respiratory: Negative.    Cardiovascular: Negative.   Gastrointestinal: Negative.   Musculoskeletal: Negative.   Skin: Negative.   Neurological: Negative.   Psychiatric/Behavioral:  Negative for depression, hallucinations and suicidal ideas. The patient is not nervous/anxious and does not have insomnia.   All other systems reviewed and are negative. Blood pressure 131/77, pulse 91, temperature 98 F (36.7 C), temperature source Oral, resp. rate 16, height 5\' 11"  (1.803 m), weight 74.4 kg, SpO2 100 %. Body mass index is 22.87 kg/m.  Mental Status Per Nursing Assessment::   On Admission:  NA  Demographic Factors:  Male, Caucasian, and Low socioeconomic status  Loss Factors: Decrease in vocational status and Financial problems/change in socioeconomic status  Historical Factors: Impulsivity  Risk Reduction Factors:   Sense of responsibility to family, Living with another person, especially a relative, Positive social support, and Expecting birth of first child this month  Continued Clinical Symptoms:  Anxiety - improved; currently well controlled Depression -  improved; currently euthymic Alcohol/Substance Abuse/Dependencies  Cognitive Features That Contribute To Risk:  None    Suicide Risk:  Minimal acute risk: No identifiable suicidal ideation.  Patients presenting with no risk factors but with morbid ruminations; may be classified as minimal risk based on the severity of the depressive symptoms   Follow-up Information     [facility]. Go on 10/26/2020.   Why: You have a hospital follow up for substance abuse intensive  outpatient therapy and medication management services on 10/26/20 at 10:00 am.  This appointment will be held in person.  Please bring photo ID, proof of any income, and social security card (if available). Contact information: [address] [place] [address] [phone]         [facility] Follow up.   Why: This provider will call you to assist with finding  primary care, dental services, and medication assistance. Contact information: P: [phone]                Plan Of Care/Follow-up recommendations:  Activity:  as tolerated  Tests:  You will periodically need to have blood drawn for lab work to monitor your liver function, blood cell count and carbamazepine/Tegretol level while you are taking carbamazepine/Tegretol.  Your outpatient doctor will let you know when lab work needs to be performed.  Other:   -Take medications as prescribed.  Do not abruptly discontinue carbamazepine/Tegretol.  Your outpatient doctor will advise you how to taper and discontinue carbamazepine/Tegretol.   -Do not drink alcohol.   -Do not use marijuana/cannabis or other drugs.   -Attend substance abuse treatment program.   -Keep outpatient mental health follow-up appointments with therapist and psychiatrist.   -See your primary care provider for treatment of medical conditions.    Claudie Revering, MD 10/23/2020, 12:47 PM

## 2020-10-23 NOTE — BHH Group Notes (Signed)
Spiritual care group on grief and loss facilitated by chaplain Dyanne Carrel, [facility]   Group Goal:   Support / Education around grief and loss   Members engage in facilitated group support and psycho-social education.   Group Description:   Following introductions and group rules, group members engaged in facilitated group dialog and support around topic of loss, with particular support around experiences of loss in their lives. Group Identified types of loss (relationships / self / things) and identified patterns, circumstances, and changes that precipitate losses. Reflected on thoughts / feelings around loss, normalized grief responses, and recognized variety in grief experience. Group noted Worden's four tasks of grief in discussion.   Group drew on Adlerian / Rogerian, narrative, MI,   Patient Progress: Jacob Johns participated in the group and was engaged in the conversation.  At times he dominated the conversation, but was open to talking further in a one-on-one conversation.  Chaplain Dyanne Carrel, B[facility] Pager, [phone] 12:31 PM

## 2020-10-23 NOTE — Progress Notes (Signed)
Discharge Note:   Pt left at 13:20 with his sister as his ride to their home. Pt upon discharge is A&Ox4, denies SI/HI/AVH, given discharge instructions, verbalized understanding of those instructions which included pt's outpt appointments for follow up and discharge prescriptions, including discharge med education. All personal belongings returned to pt upon discharge.

## 2020-10-23 NOTE — Progress Notes (Signed)
  [facility] Adult Case Management Discharge Plan :  Will you be returning to the same living situation after discharge:  Yes,  Sister At discharge, do you have transportation home?: Yes,  Sister Do you have the ability to pay for your medications: Yes,  Out of State Medicaid   Release of information consent forms completed and in the chart;  Patient's signature needed at discharge.  Patient to Follow up at:  Follow-up Information     [facility]. Go on 10/26/2020.   Why: You have a hospital follow up for substance abuse intensive outpatient therapy and medication management services on 10/26/20 at 10:00 am.  This appointment will be held in person.  Please bring photo ID, proof of any income, and social security card (if available). Contact information: [address] [place] [address] [phone]         Care Connect Program [facility] Follow up.   Why: This provider will call you to assist with finding  primary care, dental services, and medication assistance. Contact information: P: [phone]                Next level of care provider has access to [facility] Link:no  Safety Planning and Suicide Prevention discussed: Yes,  with patient and sister     Has patient been referred to the Quitline?: Patient refused referral  Patient has been referred for addiction treatment: Yes  Aram Beecham, LCSWA 10/23/2020, 9:39 AM
